# Patient Record
Sex: Female | Born: 1995 | Race: White | Hispanic: No | Marital: Married | Smoking: Never smoker
Health system: Southern US, Community
[De-identification: ages and names within clinical notes are randomized; demographics above are authoritative.]

## PROBLEM LIST (undated history)

## (undated) DIAGNOSIS — G9050 Complex regional pain syndrome I, unspecified: Secondary | ICD-10-CM

## (undated) DIAGNOSIS — G90A Postural orthostatic tachycardia syndrome (POTS): Secondary | ICD-10-CM

## (undated) DIAGNOSIS — J45909 Unspecified asthma, uncomplicated: Secondary | ICD-10-CM

## (undated) HISTORY — DX: Postural orthostatic tachycardia syndrome (POTS): G90.A

## (undated) HISTORY — PX: INTERCOSTAL NERVE BLOCK: SHX5021

---
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
## 2017-04-02 ENCOUNTER — Ambulatory Visit (INDEPENDENT_AMBULATORY_CARE_PROVIDER_SITE_OTHER): Payer: BLUE CROSS/BLUE SHIELD

## 2017-04-02 ENCOUNTER — Other Ambulatory Visit: Payer: Self-pay

## 2017-04-02 ENCOUNTER — Ambulatory Visit
Admission: EM | Admit: 2017-04-02 | Discharge: 2017-04-02 | Disposition: A | Discharge status: Home / Self Care | Payer: BLUE CROSS/BLUE SHIELD | Attending: Family Medicine | Admitting: Family Medicine

## 2017-04-02 DIAGNOSIS — R079 Chest pain, unspecified: Secondary | ICD-10-CM | POA: Diagnosis not present

## 2017-04-02 DIAGNOSIS — R0789 Other chest pain: Secondary | ICD-10-CM

## 2017-04-02 DIAGNOSIS — R0602 Shortness of breath: Secondary | ICD-10-CM

## 2017-04-02 DIAGNOSIS — G9050 Complex regional pain syndrome I, unspecified: Secondary | ICD-10-CM | POA: Insufficient documentation

## 2017-04-02 DIAGNOSIS — J069 Acute upper respiratory infection, unspecified: Secondary | ICD-10-CM

## 2017-04-02 DIAGNOSIS — M940 Chondrocostal junction syndrome [Tietze]: Secondary | ICD-10-CM | POA: Diagnosis not present

## 2017-04-02 HISTORY — DX: Complex regional pain syndrome I, unspecified: G90.50

## 2017-04-02 LAB — BASIC METABOLIC PANEL
ANION GAP: 9 (ref 5–15)
BUN: 23 mg/dL — ABNORMAL HIGH (ref 6–20)
CALCIUM: 9.3 mg/dL (ref 8.9–10.3)
CO2: 26 mmol/L (ref 22–32)
Chloride: 103 mmol/L (ref 101–111)
Creatinine, Ser: 0.72 mg/dL (ref 0.44–1.00)
Glucose, Bld: 114 mg/dL — ABNORMAL HIGH (ref 65–99)
Potassium: 3.6 mmol/L (ref 3.5–5.1)
Sodium: 138 mmol/L (ref 135–145)

## 2017-04-02 LAB — CBC WITH DIFFERENTIAL/PLATELET
BASOS ABS: 0 10*3/uL (ref 0–0.1)
BASOS PCT: 0 %
Eosinophils Absolute: 0.1 10*3/uL (ref 0–0.7)
Eosinophils Relative: 1 %
HCT: 39 % (ref 35.0–47.0)
Hemoglobin: 13.2 g/dL (ref 12.0–16.0)
Lymphocytes Relative: 37 %
Lymphs Abs: 2.7 10*3/uL (ref 1.0–3.6)
MCH: 29.7 pg (ref 26.0–34.0)
MCHC: 33.9 g/dL (ref 32.0–36.0)
MCV: 87.6 fL (ref 80.0–100.0)
MONO ABS: 0.4 10*3/uL (ref 0.2–0.9)
Monocytes Relative: 6 %
Neutro Abs: 4.1 10*3/uL (ref 1.4–6.5)
Neutrophils Relative %: 56 %
PLATELETS: 240 10*3/uL (ref 150–440)
RBC: 4.46 MIL/uL (ref 3.80–5.20)
RDW: 12.7 % (ref 11.5–14.5)
WBC: 7.3 10*3/uL (ref 3.6–11.0)

## 2017-04-02 LAB — TROPONIN I

## 2017-04-02 LAB — FIBRIN DERIVATIVES D-DIMER (ARMC ONLY): Fibrin derivatives D-dimer (ARMC): 94.03 ng/mL (FEU) (ref 0.00–499.00)

## 2017-04-02 MED ORDER — MELOXICAM 7.5 MG PO TABS: 7.5000 mg | ORAL_TABLET | Freq: Every day | ORAL | 0 refills | Status: DC

## 2017-04-02 NOTE — ED Triage Notes (Signed)
Patient complains of center chest pain that started 1 week ago but intensified yesterday. Patient states that the pain is sharp and takes her breath away. Patient reports that the pain has been constant. States that she has shortness of breath.

## 2017-04-02 NOTE — Discharge Instructions (Signed)
Take medication as prescribed. Rest. Drink plenty of fluids.  ° °Follow up with your primary care physician this week as needed. Return to Urgent care for new or worsening concerns.  ° °

## 2017-04-02 NOTE — ED Provider Notes (Signed)
[facility] ____________________________________________  Time seen: Approximately 8:49 PM  I have reviewed the triage vital signs and the nursing notes.   HISTORY  Chief Complaint Chest Pain   HPI Sherry Lutz is a 22 y.o. female presenting for evaluation of midsternal chest pain and intermittent shortness of breath that is been present for 1 week and felt like it worsened yesterday.  States pain is always constant but intermittent has episodes that pain is worse and denies aggravating or alleviating factors.  Denies fall, injury or trauma.  Reports has had some recent nasal congestion and bilateral ear congestion for the same time..  No coughing.  Denies cough, sore throat, fevers, hemoptysis, sinus pressure, thick drainage, rash,  extremity swelling, extremity pain, recent trips, recent immobilization, history of cancer.  Denies personal cardiac history or blood clot history and also denies family cardiac history or blood clot history, except for reporting her sister had congenital heart defects at birth and has had multiple heart surgeries.  Patient states that pain is in the middle of her chest and also throughout the whole front of her chest and does hurt some worse with palpation.  Denies pain with movement.  States shortness of breath is intermittent.  States that she does feel some anxiety about the chest pain but does not feel like the chest pain is caused by anxiety itself.  Reports otherwise feels well.  Denies other recent sickness or recent changes.  Not a smoker.  Denies drugs or alcohol.  Does take oral contraceptives.  Denies chance of pregnancy.  Denies abdominal pain, dysuria, extremity pain, extremity swelling or rash. Denies recent sickness. Denies recent antibiotic use.   [place]: PCP No LMP recorded. Patient is not currently having periods (Reason: Oral contraceptives).   Past Medical History:  Diagnosis Date  . RSD (reflex  sympathetic dystrophy)    foot    There are no active problems to display for this patient.   History reviewed. No pertinent surgical history.   No current facility-administered medications for this encounter.   Current Outpatient Medications:  .  norethindrone-ethinyl estradiol-iron (ESTROSTEP FE,TILIA FE,TRI-LEGEST FE) 1-20/1-30/1-35 MG-MCG tablet, Take 1 tablet by mouth daily., Disp: , Rfl:  .  meloxicam (MOBIC) 7.5 MG tablet, Take 1 tablet (7.5 mg total) by mouth daily., Disp: 10 tablet, Rfl: 0  Allergies Patient has no known allergies.  family history NO PE/DVT Sister: congenital heart defect  Social History Social History   Tobacco Use  . Smoking status: Never Smoker  . Smokeless tobacco: Never Used  Substance Use Topics  . Alcohol use: No    Frequency: Never  . Drug use: No    Review of Systems Constitutional: No fever/chills Eyes: No visual changes. ENT: No sore throat. Cardiovascular: As above.  Respiratory: As above.  Denies current shortness of breath. Gastrointestinal: No abdominal pain.  No nausea, no vomiting.  No diarrhea.  No constipation. Genitourinary: Negative for dysuria. Musculoskeletal: Negative for back pain. Skin: Negative for rash. Neurological: Negative for headaches, focal weakness or numbness.  ____________________________________________   PHYSICAL EXAM:  VITAL SIGNS: ED Triage Vitals  Enc Vitals Group     BP 04/02/17 1900 139/79     Pulse Rate 04/02/17 1900 79     Resp 04/02/17 1900 18     Temp 04/02/17 1900 98.7 F (37.1 C)     Temp Source 04/02/17 1900 Oral     SpO2 04/02/17 1900 100 %     Weight 04/02/17  1858 128 lb (58.1 kg)     Height 04/02/17 1858 5\' 2"  (1.575 m)     Head Circumference --      Peak Flow --      Pain Score 04/02/17 1858 9     Pain Loc --      Pain Edu? --      Excl. in GC? --    Constitutional: Alert and oriented. Well appearing and in no acute distress. Eyes: Conjunctivae are normal.  Head:  Atraumatic. No sinus tenderness to palpation. No swelling. No erythema.  Ears: no erythema, mild bilateral effusion, normal TMs bilaterally.   Nose:No nasal congestion   Mouth/Throat: Mucous membranes are moist. No pharyngeal erythema. No tonsillar swelling or exudate.  Neck: No stridor.  No cervical spine tenderness to palpation. Hematological/Lymphatic/Immunilogical: No cervical lymphadenopathy. Cardiovascular: Normal rate, regular rhythm. Grossly normal heart sounds.  Good peripheral circulation. Respiratory: Normal respiratory effort.  No retractions. No wheezes, rales or rhonchi. Good air movement.  Gastrointestinal: Mild epigastric tenderness.  Abdomen otherwise soft and nontender. No CVA tenderness. Musculoskeletal: Ambulatory with steady gait. No cervical, thoracic or lumbar tenderness to palpation.  Bilateral lower extremities nontender and no edema.  Bilateral distal pedal pulses equal and easily palpated.  Anterior chest bilateral as well as midsternal diffuse tenderness to palpation and per patient consistent with current chest pain description. Neurologic:  Normal speech and language. No gait instability.  No focal neurological deficits. Skin:  Skin appears warm, dry and intact. No rash noted. Psychiatric: Mood and affect are normal. Speech and behavior are normal.  Well's criteria for PE=0. Low risk group. ___________________________________________   LABS (all labs ordered are listed, but only abnormal results are displayed)  Labs Reviewed  BASIC METABOLIC PANEL - Abnormal; Notable for the following components:      Result Value   Glucose, Bld 114 (*)    BUN 23 (*)    All other components within normal limits  CBC WITH DIFFERENTIAL/PLATELET  FIBRIN DERIVATIVES D-DIMER ([facility] ONLY)  TROPONIN I   ____________________________________________  EKG  ED ECG REPORT I, Renford Dills, the attending provider, personally viewed and interpreted this ECG.   Date: 04/02/2017   EKG Time: 1907  Rate: 82  Rhythm: normal sinus rhythm with sinus arrhythmia   Axis: normal  Intervals:none  ST&T Change: no ST or T wave elevation or depression noted.    No previous EKG available for comparison.    ____________________________________________  RADIOLOGY  Dg Chest 2 View  Result Date: 04/02/2017 CLINICAL DATA:  Chest pain and shortness of breath. EXAM: CHEST  2 VIEW COMPARISON:  None. FINDINGS: The heart size and mediastinal contours are within normal limits. There is no evidence of pulmonary edema, consolidation, pneumothorax, nodule or pleural fluid. The visualized skeletal structures are unremarkable. IMPRESSION: No active cardiopulmonary disease. Electronically Signed   By: Irish Lack M.D.   On: 04/02/2017 19:59   ____________________________________________   PROCEDURES Procedures     INITIAL IMPRESSION / ASSESSMENT AND PLAN / ED COURSE  Pertinent labs & imaging results that were available during my care of the patient were reviewed by me and considered in my medical decision making (see chart for details).  Very well-appearing patient.  No acute distress.  Mother and sister at bedside.  Chest pain for 1 week worsened since yesterday with some intermittent shortness of breath, also suspect recent upper respiratory infection.  Suspect most likely costochondritis, felt ess likely ACS, pneumothorax or pulmonary embolism.  Patient only risk factor  for PE oral contraceptives.  Discussed this in very detail with patient and family who does request to have d-dimer and troponin evaluated.  Labs reviewed.  Troponin and d-dimer negative.  Chest x-ray as above negative per radiologist.  Discussed labs in detail including slightly elevated BUN.  Suspect likely culprit is costochondritis.  Will treat with daily Mobic as needed.  Encourage rest, fluids, supportive care and discussed strict follow-up and return parameters with primary as well as up to need an proceeding to  ER.Discussed indication, risks and benefits of medications with patient.  Discussed follow up with Primary care physician this week. Discussed follow up and return parameters including no resolution or any worsening concerns. Patient verbalized understanding and agreed to plan.   ____________________________________________   FINAL CLINICAL IMPRESSION(S) / ED DIAGNOSES  Final diagnoses:  Atypical chest pain  Costochondritis  Acute upper respiratory infection     ED Discharge Orders        Ordered    meloxicam (MOBIC) 7.5 MG tablet  Daily,   Status:  Discontinued     04/02/17 2032    meloxicam (MOBIC) 7.5 MG tablet  Daily     04/02/17 2039       Note: This dictation was prepared with Dragon dictation along with smaller phrase technology. Any transcriptional errors that result from this process are unintentional.         Renford DillsMiller, Annabeth Tortora, NP 04/02/17 [phone]

## 2017-08-14 ENCOUNTER — Other Ambulatory Visit
Admission: RE | Admit: 2017-08-14 | Discharge: 2017-08-14 | Disposition: A | Discharge status: Home / Self Care | Payer: BLUE CROSS/BLUE SHIELD | Source: Ambulatory Visit | Attending: Specialist | Admitting: Specialist

## 2018-05-25 ENCOUNTER — Ambulatory Visit
Admission: EM | Admit: 2018-05-25 | Discharge: 2018-05-25 | Disposition: A | Discharge status: Other Acute Inpatient Hospital | Payer: BLUE CROSS/BLUE SHIELD

## 2018-05-25 ENCOUNTER — Encounter: Payer: Self-pay | Admitting: Emergency Medicine

## 2018-05-25 ENCOUNTER — Other Ambulatory Visit: Payer: Self-pay

## 2018-05-25 DIAGNOSIS — R1031 Right lower quadrant pain: Secondary | ICD-10-CM | POA: Diagnosis not present

## 2018-05-25 NOTE — Discharge Instructions (Addendum)
Go straight to ER, do not eat or drink anything until seen by Er provider and cleared.

## 2018-05-25 NOTE — ED Provider Notes (Signed)
[facility]    CSN: [id number] Arrival date & time: 05/25/18  1754     History   Chief Complaint Chief Complaint  Patient presents with  . Abdominal Pain    RLQ     HPI Sherry Lutz is a 23 y.o. female.   The history is provided by the patient. No language interpreter was used.  Abdominal Pain  Pain location:  RLQ Pain quality: sharp and stabbing   Pain radiates to:  Does not radiate Pain severity:  Severe Onset quality:  Sudden Duration:  1 day Timing:  Constant Progression:  Worsening Chronicity:  New Context: not suspicious food intake and not trauma   Relieved by:  Nothing Ineffective treatments: walking,movement, palpation. Associated symptoms: diarrhea and nausea   Associated symptoms: no vomiting   Risk factors comment:  Pt uses OBCP's no periods   Past Medical History:  Diagnosis Date  . RSD (reflex sympathetic dystrophy)    foot    Patient Active Problem List   Diagnosis Date Noted  . Abdominal pain, RLQ (right lower quadrant) 05/25/2018    Past Surgical History:  Procedure Laterality Date  . INTERCOSTAL NERVE BLOCK      OB History   No obstetric history on file.      Home Medications    Prior to Admission medications   Medication Sig Start Date End Date Taking? Authorizing Provider  meloxicam (MOBIC) 7.5 MG tablet Take 1 tablet (7.5 mg total) by mouth daily. 04/02/17  Yes Renford Dills, NP  norethindrone-ethinyl estradiol-iron (ESTROSTEP FE,TILIA FE,TRI-LEGEST FE) 1-20/1-30/1-35 MG-MCG tablet Take 1 tablet by mouth daily.   Yes [provider]    Family History Family History  Problem Relation Age of Onset  . Healthy Mother   . Healthy Father     Social History Social History   Tobacco Use  . Smoking status: Never Smoker  . Smokeless tobacco: Never Used  Substance Use Topics  . Alcohol use: No    Frequency: Never  . Drug use: No     Allergies   Pregabalin and Red dye   Review of  Systems Review of Systems  Gastrointestinal: Positive for abdominal pain, diarrhea and nausea. Negative for vomiting.  All other systems reviewed and are negative.    Physical Exam Triage Vital Signs ED Triage Vitals  Enc Vitals Group     BP 05/25/18 1808 135/89     Pulse Rate 05/25/18 1808 89     Resp --      Temp 05/25/18 1808 98 F (36.7 C)     Temp Source 05/25/18 1808 Oral     SpO2 05/25/18 1808 100 %     Weight 05/25/18 1809 125 lb (56.7 kg)     Height 05/25/18 1809 5\' 3"  (1.6 m)     Head Circumference --      Peak Flow --      Pain Score 05/25/18 1808 7     Pain Loc --      Pain Edu? --      Excl. in GC? --    No data found.  Updated Vital Signs BP 135/89 (BP Location: Left Arm)   Pulse 89   Temp 98 F (36.7 C) (Oral)   Ht 5\' 3"  (1.6 m)   Wt 125 lb (56.7 kg)   SpO2 100%   BMI 22.14 kg/m      Physical Exam Vitals signs and nursing note reviewed.  Constitutional:      Appearance:  She is well-developed.  Abdominal:     General: Bowel sounds are increased.     Tenderness: There is abdominal tenderness in the right lower quadrant. There is guarding and rebound. Positive signs include psoas sign.  Neurological:     General: No focal deficit present.     Mental Status: She is alert and oriented to person, place, and time.     GCS: GCS eye subscore is 4. GCS verbal subscore is 5. GCS motor subscore is 6.  Psychiatric:        Attention and Perception: Attention normal.        Thought Content: Thought content normal.      UC Treatments / Results  Labs (all labs ordered are listed, but only abnormal results are displayed) Labs Reviewed - No data to display  EKG None  Radiology No results found.  Procedures Procedures (including critical care time)  Medications Ordered in UC Medications - No data to display  Initial Impression / Assessment and Plan / UC Course  I have reviewed the triage vital signs and the nursing notes.  Pertinent labs &  imaging results that were available during my care of the patient were reviewed by me and considered in my medical decision making (see chart for details).     Pt sent to Er for further evaluation and testing to r/o ovarian cyst, ovarian torsion or appendicitis. Mom w patient, pt verbalized understanding to this provider.  Final Clinical Impressions(s) / UC Diagnoses   Final diagnoses:  Abdominal pain, RLQ (right lower quadrant)     Discharge Instructions     Go straight to ER, do not eat or drink anything until seen by Er provider and cleared.     ED Prescriptions    None     Controlled Substance Prescriptions    Clancy Gourd, NP 05/25/18 2045

## 2018-05-25 NOTE — ED Triage Notes (Signed)
Patient in today c/o RLQ abdominal pain that started last night after eating. Patient states the pain is getting worse. Hurts to the touch and is nauseous. Patient has felt clammy, but hasn't taken her temperature.

## 2018-12-20 ENCOUNTER — Other Ambulatory Visit: Payer: Self-pay

## 2018-12-20 ENCOUNTER — Ambulatory Visit
Admission: EM | Admit: 2018-12-20 | Discharge: 2018-12-20 | Disposition: A | Discharge status: Home / Self Care | Payer: BLUE CROSS/BLUE SHIELD | Attending: Internal Medicine | Admitting: Internal Medicine

## 2018-12-20 DIAGNOSIS — B373 Candidiasis of vulva and vagina: Secondary | ICD-10-CM | POA: Diagnosis not present

## 2018-12-20 DIAGNOSIS — Z3202 Encounter for pregnancy test, result negative: Secondary | ICD-10-CM | POA: Diagnosis not present

## 2018-12-20 DIAGNOSIS — N760 Acute vaginitis: Secondary | ICD-10-CM | POA: Diagnosis not present

## 2018-12-20 DIAGNOSIS — B9689 Other specified bacterial agents as the cause of diseases classified elsewhere: Secondary | ICD-10-CM

## 2018-12-20 DIAGNOSIS — N3000 Acute cystitis without hematuria: Secondary | ICD-10-CM | POA: Diagnosis not present

## 2018-12-20 DIAGNOSIS — B3731 Acute candidiasis of vulva and vagina: Secondary | ICD-10-CM

## 2018-12-20 HISTORY — DX: Unspecified asthma, uncomplicated: J45.909

## 2018-12-20 LAB — URINALYSIS, COMPLETE (UACMP) WITH MICROSCOPIC
Bilirubin Urine: NEGATIVE
Glucose, UA: NEGATIVE mg/dL
Ketones, ur: NEGATIVE mg/dL
Nitrite: NEGATIVE
Protein, ur: NEGATIVE mg/dL
Specific Gravity, Urine: >1.03 — ABNORMAL HIGH (ref 1.005–1.030)
pH: 6 (ref 5.0–8.0)

## 2018-12-20 LAB — WET PREP, GENITAL
Sperm: NONE SEEN
Trich, Wet Prep: NONE SEEN

## 2018-12-20 LAB — PREGNANCY, URINE: Preg Test, Ur: NEGATIVE

## 2018-12-20 MED ORDER — FLUCONAZOLE 150 MG PO TABS: ORAL_TABLET | ORAL | 0 refills | Status: DC

## 2018-12-20 MED ORDER — PHENAZOPYRIDINE HCL 200 MG PO TABS: 200.0000 mg | ORAL_TABLET | Freq: Three times a day (TID) | ORAL | 0 refills | Status: DC

## 2018-12-20 MED ORDER — CEPHALEXIN 500 MG PO CAPS: 500.0000 mg | ORAL_CAPSULE | Freq: Two times a day (BID) | ORAL | 0 refills | Status: DC

## 2018-12-20 MED ORDER — METRONIDAZOLE 500 MG PO TABS: 500.0000 mg | ORAL_TABLET | Freq: Two times a day (BID) | ORAL | 0 refills | Status: DC

## 2018-12-20 NOTE — ED Provider Notes (Addendum)
[facility]    CSN: [id number] Arrival date & time: 12/20/18  1742      History   Chief Complaint Chief Complaint  Patient presents with  . Urinary Frequency    HPI Sherry Lutz is a 23 y.o. female.   HPI  23 year old female presents with urinary frequency and dysuria described as burning and malodorous smelling urine that started yesterday.  She states that it feels similar to previous UTIs.  Has lower abdominal discomfort as well.  Denies any back pain.  She has had no fever chills nausea or vomiting.  Is sexually active.  Has noticed a white discharge which is unusual for her.  Has not noticed any external irritation.  Has recently gotten married; she has no concerns for STDs.  Afebrile at 98.2 pulse rate 82 blood pressure 150/83 O2 sats on room air 100%.         Past Medical History:  Diagnosis Date  . Asthma   . RSD (reflex sympathetic dystrophy)    foot    Patient Active Problem List   Diagnosis Date Noted  . Abdominal pain, RLQ (right lower quadrant) 05/25/2018    Past Surgical History:  Procedure Laterality Date  . INTERCOSTAL NERVE BLOCK      OB History   No obstetric history on file.      Home Medications    Prior to Admission medications   Medication Sig Start Date End Date Taking? Authorizing Provider  norethindrone-ethinyl estradiol-iron (ESTROSTEP FE,TILIA FE,TRI-LEGEST FE) 1-20/1-30/1-35 MG-MCG tablet Take 1 tablet by mouth daily.   Yes [provider]  cephALEXin (KEFLEX) 500 MG capsule Take 1 capsule (500 mg total) by mouth 2 (two) times daily. 12/20/18   Lorin Picket, PA-C  fluconazole (DIFLUCAN) 150 MG tablet Take one tab for symptoms of yeast infection. Repeat x 1 in 72 hours. 12/20/18   Lorin Picket, PA-C  metroNIDAZOLE (FLAGYL) 500 MG tablet Take 1 tablet (500 mg total) by mouth 2 (two) times daily. 12/20/18   Lorin Picket, PA-C  phenazopyridine (PYRIDIUM) 200 MG tablet Take 1 tablet (200 mg total)  by mouth 3 (three) times daily. 12/20/18   Lorin Picket, PA-C    Family History Family History  Problem Relation Age of Onset  . Healthy Mother   . Healthy Father     Social History Social History   Tobacco Use  . Smoking status: Never Smoker  . Smokeless tobacco: Never Used  Substance Use Topics  . Alcohol use: No    Frequency: Never  . Drug use: No     Allergies   Pregabalin and Red dye   Review of Systems Review of Systems  Constitutional: Positive for activity change. Negative for appetite change, chills, fatigue and fever.  Genitourinary: Positive for dysuria, frequency, urgency and vaginal discharge. Negative for hematuria.  All other systems reviewed and are negative.    Physical Exam Triage Vital Signs ED Triage Vitals  Enc Vitals Group     BP 12/20/18 1751 (!) 150/83     Pulse Rate 12/20/18 1751 82     Resp 12/20/18 1751 18     Temp 12/20/18 1751 98.2 F (36.8 C)     Temp Source 12/20/18 1751 Oral     SpO2 12/20/18 1751 100 %     Weight 12/20/18 1752 130 lb (59 kg)     Height 12/20/18 1752 5\' 2"  (1.575 m)     Head Circumference --  Peak Flow --      Pain Score 12/20/18 1751 4     Pain Loc --      Pain Edu? --      Excl. in GC? --    No data found.  Updated Vital Signs BP (!) 150/83 (BP Location: Right Arm)   Pulse 82   Temp 98.2 F (36.8 C) (Oral)   Resp 18   Ht 5\' 2"  (1.575 m)   Wt 130 lb (59 kg)   LMP 12/06/2018 (Approximate)   SpO2 100%   BMI 23.78 kg/m   Visual Acuity Right Eye Distance:   Left Eye Distance:   Bilateral Distance:    Right Eye Near:   Left Eye Near:    Bilateral Near:     Physical Exam Vitals signs and nursing note reviewed.  Constitutional:      General: She is not in acute distress.    Appearance: Normal appearance. She is normal weight. She is not ill-appearing, toxic-appearing or diaphoretic.  HENT:     Head: Normocephalic and atraumatic.     Mouth/Throat:     Mouth: Mucous membranes are  moist.  Eyes:     Conjunctiva/sclera: Conjunctivae normal.  Neck:     Musculoskeletal: Normal range of motion and neck supple.  Cardiovascular:     Rate and Rhythm: Normal rate and regular rhythm.     Pulses: Normal pulses.     Heart sounds: Normal heart sounds.  Pulmonary:     Effort: Pulmonary effort is normal. No respiratory distress.     Breath sounds: Normal breath sounds. No stridor. No wheezing, rhonchi or rales.  Abdominal:     General: Abdomen is flat. There is no distension.     Palpations: Abdomen is soft.     Tenderness: There is abdominal tenderness. There is no right CVA tenderness, left CVA tenderness, guarding or rebound.     Comments: Patient has normal bowel sounds present.  She has mild tenderness in the suprapubic region.  No rebound and no guarding present.  Genitourinary:    Comments: Patient performed a self swab for a wet prep Musculoskeletal: Normal range of motion.  Skin:    General: Skin is warm and dry.  Neurological:     General: No focal deficit present.     Mental Status: She is alert and oriented to person, place, and time.  Psychiatric:        Mood and Affect: Mood normal.        Behavior: Behavior normal.        Thought Content: Thought content normal.        Judgment: Judgment normal.      UC Treatments / Results  Labs (all labs ordered are listed, but only abnormal results are displayed) Labs Reviewed  WET PREP, GENITAL - Abnormal; Notable for the following components:      Result Value   Yeast Wet Prep HPF POC PRESENT (*)    Clue Cells Wet Prep HPF POC PRESENT (*)    WBC, Wet Prep HPF POC MANY (*)    All other components within normal limits  URINALYSIS, COMPLETE (UACMP) WITH MICROSCOPIC - Abnormal; Notable for the following components:   APPearance HAZY (*)    Specific Gravity, Urine >1.030 (*)    Hgb urine dipstick TRACE (*)    Leukocytes,Ua SMALL (*)    Bacteria, UA FEW (*)    All other components within normal limits  URINE  CULTURE  PREGNANCY, URINE  EKG   Radiology No results found.  Procedures Procedures (including critical care time)  Medications Ordered in UC Medications - No data to display  Initial Impression / Assessment and Plan / UC Course  I have reviewed the triage vital signs and the nursing notes.  Pertinent labs & imaging results that were available during my care of the patient were reviewed by me and considered in my medical decision making (see chart for details).    23 year old female presents with urinary frequency and dysuria described as burning along with malodorous urine.  She also reported a unusual vaginal discharge.  Recently married and had just completed her honeymoon.  She had no concerns for STDs.  Urinalysis was not an adequate clean-catch did reveal 6-10 epithelial cells and 6-10 RBCs and 11-20 white blood cells along with a few bacteria.  Patient did perform a self wet prep swab which was positive for yeast and clue cells.  I started her on Keflex twice daily for 5 days Pyridium because of the urinary burning which she states was rather severe as well as Diflucan and Flagyl.  Given precautions of not drinking alcohol while on the Flagyl and to really abstain from sexual intercourse during that time.  She has an allergy to red dye which she states causes a rash mostly on her hands and face.  States that she wants to take the medications despite the warnings regarding red dye allergy.  She may take a Claritin or Zyrtec or Allegra for prophylactically which may help with the rash.  I will also obtain a urine culture.  If she is not improving in 2 to 4 days she should follow-up with her primary care physician or return to our clinic.   Final Clinical Impressions(s) / UC Diagnoses   Final diagnoses:  Acute cystitis without hematuria  BV (bacterial vaginosis)  Yeast vaginitis     Discharge Instructions     Drink plenty of water.  Do not drink alcohol while taking Flagyl.   If you are not improving recommend following up with your primary care physician    ED Prescriptions    Medication Sig Dispense Auth. Provider   cephALEXin (KEFLEX) 500 MG capsule Take 1 capsule (500 mg total) by mouth 2 (two) times daily. 10 capsule Ovid Curd P, PA-C   phenazopyridine (PYRIDIUM) 200 MG tablet Take 1 tablet (200 mg total) by mouth 3 (three) times daily. 6 tablet Ovid Curd P, PA-C   metroNIDAZOLE (FLAGYL) 500 MG tablet Take 1 tablet (500 mg total) by mouth 2 (two) times daily. 14 tablet Ovid Curd P, PA-C   fluconazole (DIFLUCAN) 150 MG tablet Take one tab for symptoms of yeast infection. Repeat x 1 in 72 hours. 2 tablet Lutricia Feil, PA-C     PDMP not reviewed this encounter.       Lutricia Feil, PA-C 12/20/18 1928

## 2018-12-20 NOTE — ED Triage Notes (Signed)
Reports urinary frequency and burning that started yesterday. Pt alert and oriented X4, cooperative, RR even and unlabored, color WNL. Pt in NAD.

## 2018-12-20 NOTE — Discharge Instructions (Signed)
Drink plenty of water.  Do not drink alcohol while taking Flagyl.  If you are not improving recommend following up with your primary care physician

## 2018-12-22 LAB — URINE CULTURE

## 2019-01-28 DIAGNOSIS — J45909 Unspecified asthma, uncomplicated: Secondary | ICD-10-CM | POA: Insufficient documentation

## 2019-03-29 ENCOUNTER — Encounter: Payer: Self-pay | Admitting: Emergency Medicine

## 2019-03-29 ENCOUNTER — Other Ambulatory Visit: Payer: Self-pay

## 2019-03-29 ENCOUNTER — Ambulatory Visit
Admission: EM | Admit: 2019-03-29 | Discharge: 2019-03-29 | Disposition: A | Discharge status: Home / Self Care | Payer: BLUE CROSS/BLUE SHIELD | Attending: Family Medicine | Admitting: Family Medicine

## 2019-03-29 DIAGNOSIS — R110 Nausea: Secondary | ICD-10-CM | POA: Insufficient documentation

## 2019-03-29 DIAGNOSIS — Z8709 Personal history of other diseases of the respiratory system: Secondary | ICD-10-CM

## 2019-03-29 DIAGNOSIS — Z20822 Contact with and (suspected) exposure to covid-19: Secondary | ICD-10-CM | POA: Insufficient documentation

## 2019-03-29 DIAGNOSIS — Z79899 Other long term (current) drug therapy: Secondary | ICD-10-CM | POA: Diagnosis not present

## 2019-03-29 DIAGNOSIS — R05 Cough: Secondary | ICD-10-CM | POA: Diagnosis not present

## 2019-03-29 DIAGNOSIS — Z7952 Long term (current) use of systemic steroids: Secondary | ICD-10-CM | POA: Diagnosis not present

## 2019-03-29 DIAGNOSIS — R0602 Shortness of breath: Secondary | ICD-10-CM | POA: Insufficient documentation

## 2019-03-29 DIAGNOSIS — J45909 Unspecified asthma, uncomplicated: Secondary | ICD-10-CM | POA: Diagnosis not present

## 2019-03-29 MED ORDER — PROAIR HFA 108 (90 BASE) MCG/ACT IN AERS: INHALATION_SPRAY | RESPIRATORY_TRACT | 3 refills | Status: DC

## 2019-03-29 MED ORDER — PREDNISONE 50 MG PO TABS: ORAL_TABLET | ORAL | 0 refills | Status: DC

## 2019-03-29 NOTE — ED Triage Notes (Signed)
Patient states her brother tested positive for COVID on Saturday. She is c/o shortness of breath that started a few days ago and cough that started yesterday. She is also reporting nausea.

## 2019-03-29 NOTE — Discharge Instructions (Signed)
Medication as prescribed.  Take care  Dr. Seraj Dunnam  

## 2019-03-29 NOTE — ED Provider Notes (Signed)
[facility]    CSN: [id number] Arrival date & time: 03/29/19  1407  History   Chief Complaint Chief Complaint  Patient presents with  . Shortness of Breath  . Nausea   HPI  24 year old female with asthma presents with respiratory symptoms and concern for COVID-19.  Patient reports that she was recently exposed to her brother who has tested positive for COVID-19.  She reports shortness of breath, chest tightness, cough.  She is also had some abdominal pain and nausea.  She is concerned that she has COVID-19.  She desires testing today.  Rates her pain a 6/10 in severity.  Pain located in the center of the chest.  She is compliant with her home asthma medications.  No other associated symptoms.  No other complaints.  PMH, Surgical Hx, Family Hx, Social History reviewed and updated as below.  Past Medical History:  Diagnosis Date  . Asthma   . RSD (reflex sympathetic dystrophy)    foot   Patient Active Problem List   Diagnosis Date Noted  . Abdominal pain, RLQ (right lower quadrant) 05/25/2018   Past Surgical History:  Procedure Laterality Date  . INTERCOSTAL NERVE BLOCK     OB History   No obstetric history on file.    Home Medications    Prior to Admission medications   Medication Sig Start Date End Date Taking? Authorizing Provider  fluticasone (FLOVENT HFA) 110 MCG/ACT inhaler Inhale into the lungs. 02/25/19 02/25/20 Yes [provider]  montelukast (SINGULAIR) 10 MG tablet Take 10 mg by mouth daily. 11/28/18  Yes [provider]  norethindrone-ethinyl estradiol-iron (ESTROSTEP FE,TILIA FE,TRI-LEGEST FE) 1-20/1-30/1-35 MG-MCG tablet Take 1 tablet by mouth daily.   Yes [provider]  predniSONE (DELTASONE) 50 MG tablet 1 tablet daily x 5 days 03/29/19   Tommie Sams, DO  PROAIR HFA 108 [phone] Base) MCG/ACT inhaler INHALE 1 PUFF BY MOUTH EVERY 6 HOURS AS NEEDED FOR WHEEZING 03/29/19   Tommie Sams, DO    Family History Family History    Problem Relation Age of Onset  . Healthy Mother   . Healthy Father     Social History Social History   Tobacco Use  . Smoking status: Never Smoker  . Smokeless tobacco: Never Used  Substance Use Topics  . Alcohol use: No  . Drug use: No     Allergies   Pregabalin and Red dye   Review of Systems Review of Systems  Constitutional: Negative for fever.  Respiratory: Positive for cough, chest tightness and shortness of breath.   Gastrointestinal: Positive for abdominal pain and nausea.   Physical Exam Triage Vital Signs ED Triage Vitals  Enc Vitals Group     BP 03/29/19 1443 135/90     Pulse Rate 03/29/19 1443 76     Resp 03/29/19 1443 18     Temp 03/29/19 1443 98.4 F (36.9 C)     Temp Source 03/29/19 1443 Oral     SpO2 03/29/19 1443 100 %     Weight 03/29/19 1439 130 lb (59 kg)     Height 03/29/19 1439 5\' 2"  (1.575 m)     Head Circumference --      Peak Flow --      Pain Score 03/29/19 1439 6     Pain Loc --      Pain Edu? --      Excl. in GC? --    Updated Vital Signs BP 135/90 (BP Location: Right Arm)  Pulse 76   Temp 98.4 F (36.9 C) (Oral)   Resp 18   Ht 5\' 2"  (1.575 m)   Wt 59 kg   SpO2 100%   BMI 23.78 kg/m   Visual Acuity Right Eye Distance:   Left Eye Distance:   Bilateral Distance:    Right Eye Near:   Left Eye Near:    Bilateral Near:     Physical Exam Vitals and nursing note reviewed.  Constitutional:      General: She is not in acute distress.    Appearance: Normal appearance. She is not ill-appearing.  HENT:     Head: Normocephalic and atraumatic.  Eyes:     General:        Right eye: No discharge.        Left eye: No discharge.     Conjunctiva/sclera: Conjunctivae normal.  Cardiovascular:     Rate and Rhythm: Normal rate and regular rhythm.     Heart sounds: No murmur.  Pulmonary:     Effort: Pulmonary effort is normal.     Breath sounds: Normal breath sounds. No wheezing, rhonchi or rales.  Neurological:     Mental  Status: She is alert.  Psychiatric:        Mood and Affect: Mood normal.        Behavior: Behavior normal.    UC Treatments / Results  Labs (all labs ordered are listed, but only abnormal results are displayed) Labs Reviewed  NOVEL CORONAVIRUS, NAA (HOSP ORDER, SEND-OUT TO REF LAB; TAT 18-24 HRS)    EKG   Radiology No results found.  Procedures Procedures (including critical care time)  Medications Ordered in UC Medications - No data to display  Initial Impression / Assessment and Plan / UC Course  I have reviewed the triage vital signs and the nursing notes.  Pertinent labs & imaging results that were available during my care of the patient were reviewed by me and considered in my medical decision making (see chart for details).    24 year old female presents with suspected COVID-19.  Given concurrent asthma, placing on prednisone.  ProAir given.  Supportive care.  Final Clinical Impressions(s) / UC Diagnoses   Final diagnoses:  Suspected COVID-19 virus infection     Discharge Instructions     Medication as prescribed.  Take care  Dr. Lacinda Axon    ED Prescriptions    Medication Sig Dispense Auth. Provider   predniSONE (DELTASONE) 50 MG tablet 1 tablet daily x 5 days 5 tablet Aiyonna Lucado G, DO   PROAIR HFA 108 (90 Base) MCG/ACT inhaler INHALE 1 PUFF BY MOUTH EVERY 6 HOURS AS NEEDED FOR WHEEZING 18 g Coral Spikes, DO     PDMP not reviewed this encounter.   Coral Spikes, [place] 03/29/19 1821

## 2019-03-30 LAB — NOVEL CORONAVIRUS, NAA (HOSP ORDER, SEND-OUT TO REF LAB; TAT 18-24 HRS): SARS-CoV-2, NAA: NOT DETECTED

## 2019-03-31 ENCOUNTER — Ambulatory Visit: Payer: BLUE CROSS/BLUE SHIELD | Attending: Internal Medicine

## 2019-03-31 DIAGNOSIS — Z20822 Contact with and (suspected) exposure to covid-19: Secondary | ICD-10-CM

## 2019-04-02 LAB — NOVEL CORONAVIRUS, NAA: SARS-CoV-2, NAA: NOT DETECTED

## 2019-08-29 ENCOUNTER — Ambulatory Visit (INDEPENDENT_AMBULATORY_CARE_PROVIDER_SITE_OTHER): Payer: BLUE CROSS/BLUE SHIELD

## 2019-08-29 ENCOUNTER — Encounter: Payer: Self-pay | Admitting: Emergency Medicine

## 2019-08-29 ENCOUNTER — Ambulatory Visit
Admission: EM | Admit: 2019-08-29 | Discharge: 2019-08-29 | Disposition: A | Discharge status: Home / Self Care | Payer: BLUE CROSS/BLUE SHIELD | Attending: Family Medicine | Admitting: Family Medicine

## 2019-08-29 ENCOUNTER — Other Ambulatory Visit: Payer: Self-pay

## 2019-08-29 DIAGNOSIS — K5900 Constipation, unspecified: Secondary | ICD-10-CM | POA: Insufficient documentation

## 2019-08-29 DIAGNOSIS — R109 Unspecified abdominal pain: Secondary | ICD-10-CM | POA: Diagnosis present

## 2019-08-29 LAB — CBC WITH DIFFERENTIAL/PLATELET
Abs Immature Granulocytes: 0.02 10*3/uL (ref 0.00–0.07)
Basophils Absolute: 0 10*3/uL (ref 0.0–0.1)
Basophils Relative: 0 %
Eosinophils Absolute: 0.1 10*3/uL (ref 0.0–0.5)
Eosinophils Relative: 1 %
HCT: 37.4 % (ref 36.0–46.0)
Hemoglobin: 12.3 g/dL (ref 12.0–15.0)
Immature Granulocytes: 0 %
Lymphocytes Relative: 33 %
Lymphs Abs: 2 10*3/uL (ref 0.7–4.0)
MCH: 29.1 pg (ref 26.0–34.0)
MCHC: 32.9 g/dL (ref 30.0–36.0)
MCV: 88.6 fL (ref 80.0–100.0)
Monocytes Absolute: 0.3 10*3/uL (ref 0.1–1.0)
Monocytes Relative: 5 %
Neutro Abs: 3.7 10*3/uL (ref 1.7–7.7)
Neutrophils Relative %: 61 %
Platelets: 271 10*3/uL (ref 150–400)
RBC: 4.22 MIL/uL (ref 3.87–5.11)
RDW: 12 % (ref 11.5–15.5)
WBC: 6.2 10*3/uL (ref 4.0–10.5)
nRBC: 0 % (ref 0.0–0.2)

## 2019-08-29 LAB — URINALYSIS, COMPLETE (UACMP) WITH MICROSCOPIC
Bilirubin Urine: NEGATIVE
Glucose, UA: NEGATIVE mg/dL
Hgb urine dipstick: NEGATIVE
Ketones, ur: NEGATIVE mg/dL
Leukocytes,Ua: NEGATIVE
Nitrite: NEGATIVE
Protein, ur: NEGATIVE mg/dL
RBC / HPF: NONE SEEN RBC/hpf (ref 0–5)
Specific Gravity, Urine: >1.03 — ABNORMAL HIGH (ref 1.005–1.030)
pH: 6.5 (ref 5.0–8.0)

## 2019-08-29 LAB — HCG, QUANTITATIVE, PREGNANCY: hCG, Beta Chain, Quant, S: <1 m[IU]/mL (ref <5)

## 2019-08-29 LAB — PREGNANCY, URINE: Preg Test, Ur: NEGATIVE

## 2019-08-29 NOTE — ED Triage Notes (Addendum)
Pt c/o lower abdominal cramping. Started about 2 weeks ago. She states she has also had some nausea. She has taken 2 home pregnancy test and both has been negative. She states she take her oral contraceptives straight through and does not have a period. She has been on this birth control for years. BM have been normal. She later states she has had some indigestion intermittently.

## 2019-08-29 NOTE — ED Provider Notes (Signed)
[facility]    CSN: [id number] Arrival date & time: 08/29/19  1500      History   Chief Complaint Chief Complaint  Patient presents with  . Abdominal Cramping    HPI Sherry Lutz is a 24 y.o. female. who presents with abdominal pain and gets worse after eating and or when she gets hungry. But today has been present all day. Pain is located on umbilical area and suprapubic region. The pain does not feel like perior cramps or when she had constipation issues last year and her CT showed inflammation. Has had increased vaginal discharge but denies concern of STD. She takes her birth control continuously.  Denies UTI symptoms, but has been voiding more often, and denies urgency. Denies vaginal bleeding. Today has been worse pain than the past 2 weeks.  Has tried Tyleno and Naproxen and has not helped her pain. Has has noticed increased heart burn in the past few days and has taken prilosec which has helped. Denies hx of ovarian cysts.  She has had increased appetite craving milk shakes    Past Medical History:  Diagnosis Date  . Asthma   . RSD (reflex sympathetic dystrophy)    foot    Patient Active Problem List   Diagnosis Date Noted  . Abdominal pain, RLQ (right lower quadrant) 05/25/2018    Past Surgical History:  Procedure Laterality Date  . INTERCOSTAL NERVE BLOCK      OB History   No obstetric history on file.      Home Medications    Prior to Admission medications   Medication Sig Start Date End Date Taking? Authorizing Provider  fluticasone (FLOVENT HFA) 110 MCG/ACT inhaler Inhale into the lungs. 02/25/19 02/25/20 Yes [provider]  montelukast (SINGULAIR) 10 MG tablet Take 10 mg by mouth daily. 11/28/18  Yes [provider]  norethindrone-ethinyl estradiol-iron (ESTROSTEP FE,TILIA FE,TRI-LEGEST FE) 1-20/1-30/1-35 MG-MCG tablet Take 1 tablet by mouth daily.   Yes [provider]  PROAIR HFA 108 [phone] Base) MCG/ACT  inhaler INHALE 1 PUFF BY MOUTH EVERY 6 HOURS AS NEEDED FOR WHEEZING 03/29/19 08/29/19  Tommie Sams, DO    Family History Family History  Problem Relation Age of Onset  . Healthy Mother   . Healthy Father     Social History Social History   Tobacco Use  . Smoking status: Never Smoker  . Smokeless tobacco: Never Used  Substance Use Topics  . Alcohol use: No  . Drug use: No     Allergies   Pregabalin and Red dye   Review of Systems Review of Systems  Constitutional: Negative for appetite change, chills, fatigue and fever.  HENT: Negative for congestion.   Gastrointestinal: Positive for abdominal distention, abdominal pain and nausea. Negative for blood in stool, constipation, diarrhea and vomiting.  Genitourinary: Positive for vaginal discharge. Negative for dysuria, frequency, hematuria, menstrual problem and urgency.  Musculoskeletal: Negative for gait problem.  Skin: Negative for wound.  Neurological: Negative for weakness.  Hematological: Negative for adenopathy.   Physical Exam Triage Vital Signs ED Triage Vitals  Enc Vitals Group     BP 08/29/19 1522 128/83     Pulse Rate 08/29/19 1522 66     Resp 08/29/19 1522 18     Temp 08/29/19 1522 98.2 F (36.8 C)     Temp Source 08/29/19 1522 Oral     SpO2 08/29/19 1522 100 %     Weight 08/29/19 1518 130 lb 1.1 oz (59 kg)  Height 08/29/19 1518 5\' 2"  (1.575 m)     Head Circumference --      Peak Flow --      Pain Score 08/29/19 1518 6     Pain Loc --      Pain Edu? --      Excl. in GC? --    No data found.  Updated Vital Signs BP 128/83 (BP Location: Left Arm)   Pulse 66   Temp 98.2 F (36.8 C) (Oral)   Resp 18   Ht 5\' 2"  (1.575 m)   Wt 130 lb 1.1 oz (59 kg)   SpO2 100%   BMI 23.79 kg/m   Visual Acuity Right Eye Distance:   Left Eye Distance:   Bilateral Distance:    Right Eye Near:   Left Eye Near:    Bilateral Near:     Physical Exam Vitals and nursing note reviewed.  Constitutional:       General: She is not in acute distress.    Appearance: Normal appearance. She is not toxic-appearing.  HENT:     Right Ear: External ear normal.     Left Ear: External ear normal.  Eyes:     General: No scleral icterus.    Conjunctiva/sclera: Conjunctivae normal.  Pulmonary:     Effort: Pulmonary effort is normal.  Abdominal:     General: Abdomen is flat. Bowel sounds are normal.     Palpations: Abdomen is soft.     Tenderness: There is abdominal tenderness. There is guarding. There is no rebound.  Genitourinary:    Comments: PELVIC BIMANUAL ONLY- no tenderness noted with cervical motion, but upon palpation of RLQ/adnexal region she is tender.  Musculoskeletal:        General: Normal range of motion.     Cervical back: Neck supple.  Skin:    General: Skin is warm and dry.     Findings: No rash.  Neurological:     Mental Status: She is alert and oriented to person, place, and time.     Motor: No weakness.     Gait: Gait normal.  Psychiatric:        Mood and Affect: Mood normal.        Behavior: Behavior normal.        Thought Content: Thought content normal.        Judgment: Judgment normal.     UC Treatments / Results  Labs (all labs ordered are listed, but only abnormal results are displayed) Labs Reviewed  URINALYSIS, COMPLETE (UACMP) WITH MICROSCOPIC - Abnormal; Notable for the following components:      Result Value   Specific Gravity, Urine >1.030 (*)    Bacteria, UA RARE (*)    All other components within normal limits  PREGNANCY, URINE  CBC WITH DIFFERENTIAL/PLATELET  HCG, QUANTITATIVE, PREGNANCY    EKG   Radiology DG Abd 2 Views  Result Date: 08/29/2019 CLINICAL DATA:  Abdominal pain for 2 weeks EXAM: ABDOMEN - 2 VIEW COMPARISON:  None. FINDINGS: Non-obstructive bowel gas pattern. Colonic stool burden suggests constipation. No abnormal abdominal calcifications. No appendicolith. IMPRESSION: No acute findings. Possible constipation. Electronically Signed    By: M.D.   On: 08/29/2019 16:57    Procedures Procedures (including critical care time)  Medications Ordered in UC Medications - No data to display  Initial Impression / Assessment and Plan / UC Course  I have reviewed the triage vital signs and the nursing notes. Pertinent labs & imaging results  that were available during my care of the patient were reviewed by me and considered in my medical decision making (see chart for details). She was told her urine pregnancy test was negative and due to having large stool in cecum I would like to do a KUB and she was agreeable.  Her KUB shows a lot of stool worse in the ascending colon.  She was advised to drink Magnesium Citrate today to help her empty out colon. If symptom dont improve she needs to go to ER for abdominal CT or pelvic ultrasound.  See instructions.     Final Clinical Impressions(s) / UC Diagnoses   Final diagnoses:  Abdominal cramping  Constipation, unspecified constipation type     Discharge Instructions     Try Citric magnesium today to help  empty out fully. If your symptoms don't improve, then go to ER. Then to prevent future constipation try C.A.L.M. natural magnesium and take it at bed time.     ED Prescriptions    None     PDMP not reviewed this encounter.   Shelby Mattocks, Hershal Coria 08/29/19 2012

## 2019-08-29 NOTE — Discharge Instructions (Addendum)
Try Citric magnesium today to help  empty out fully. If your symptoms don't improve, then go to ER. Then to prevent future constipation try C.A.L.M. natural magnesium and take it at bed time.

## 2020-04-04 ENCOUNTER — Other Ambulatory Visit: Payer: Self-pay

## 2020-04-04 ENCOUNTER — Ambulatory Visit
Admission: EM | Admit: 2020-04-04 | Discharge: 2020-04-04 | Disposition: A | Discharge status: Home / Self Care | Payer: 59 | Attending: Family Medicine | Admitting: Family Medicine

## 2020-04-04 ENCOUNTER — Encounter: Payer: Self-pay | Admitting: Emergency Medicine

## 2020-04-04 DIAGNOSIS — Z20822 Contact with and (suspected) exposure to covid-19: Secondary | ICD-10-CM | POA: Diagnosis not present

## 2020-04-04 DIAGNOSIS — B349 Viral infection, unspecified: Secondary | ICD-10-CM | POA: Diagnosis not present

## 2020-04-04 MED ORDER — KETOROLAC TROMETHAMINE 10 MG PO TABS: 10.0000 mg | ORAL_TABLET | Freq: Four times a day (QID) | ORAL | 0 refills | Status: DC | PRN

## 2020-04-04 NOTE — ED Triage Notes (Signed)
Pt states that her partner tested positive for Covid yesterday. Pt states that she has sore throat and body aches, sx started Monday afternoon.

## 2020-04-04 NOTE — ED Provider Notes (Signed)
[facility]    CSN: [id number] Arrival date & time: 04/04/20  1701      History   Chief Complaint Chief Complaint  Patient presents with  . Sore Throat  . Generalized Body Aches  . Nasal Congestion   HPI  25 year old female presents with the above complaints.  2-day history of symptoms.  Patient reports that her significant other is positive for COVID-19.  She has had sore throat, congestion, and back pain.  Denies fever.  She otherwise feels well.  Desires COVID testing today.  No other complaints or concerns at this time.  Past Medical History:  Diagnosis Date  . Asthma   . RSD (reflex sympathetic dystrophy)    foot    Patient Active Problem List   Diagnosis Date Noted  . Abdominal pain, RLQ (right lower quadrant) 05/25/2018    Past Surgical History:  Procedure Laterality Date  . INTERCOSTAL NERVE BLOCK      OB History   No obstetric history on file.      Home Medications    Prior to Admission medications   Medication Sig Start Date End Date Taking? Authorizing Provider  ketorolac (TORADOL) 10 MG tablet Take 1 tablet (10 mg total) by mouth every 6 (six) hours as needed for moderate pain or severe pain. 04/04/20  Yes Saharah Sherrow G, DO  montelukast (SINGULAIR) 10 MG tablet Take 10 mg by mouth daily. 11/28/18  Yes [provider]  norethindrone-ethinyl estradiol-iron (ESTROSTEP FE,TILIA FE,TRI-LEGEST FE) 1-20/1-30/1-35 MG-MCG tablet Take 1 tablet by mouth daily.   Yes [provider]  fluticasone (FLOVENT HFA) 110 MCG/ACT inhaler Inhale into the lungs. 02/25/19 02/25/20  [provider]  PROAIR HFA 108 (90 Base) MCG/ACT inhaler INHALE 1 PUFF BY MOUTH EVERY 6 HOURS AS NEEDED FOR WHEEZING 03/29/19 08/29/19  Tommie Sams, DO    Family History Family History  Problem Relation Age of Onset  . Healthy Mother   . Healthy Father     Social History Social History   Tobacco Use  . Smoking status: Never Smoker  . Smokeless  tobacco: Never Used  Vaping Use  . Vaping Use: Never used  Substance Use Topics  . Alcohol use: No  . Drug use: No     Allergies   Pregabalin and Red dye   Review of Systems Review of Systems  Constitutional: Negative for fever.  HENT: Positive for congestion and sore throat.   Musculoskeletal: Positive for back pain.   Physical Exam Triage Vital Signs ED Triage Vitals  Enc Vitals Group     BP 04/04/20 1806 128/83     Pulse Rate 04/04/20 1806 69     Resp 04/04/20 1806 18     Temp 04/04/20 1806 98.3 F (36.8 C)     Temp Source 04/04/20 1806 Oral     SpO2 04/04/20 1806 100 %     Weight --      Height --      Head Circumference --      Peak Flow --      Pain Score 04/04/20 1804 0     Pain Loc --      Pain Edu? --      Excl. in GC? --    Updated Vital Signs BP 128/83   Pulse 69   Temp 98.3 F (36.8 C) (Oral)   Resp 18   SpO2 100%   Visual Acuity Right Eye Distance:   Left Eye Distance:   Bilateral Distance:  Right Eye Near:   Left Eye Near:    Bilateral Near:     Physical Exam Vitals and nursing note reviewed.  Constitutional:      General: She is not in acute distress.    Appearance: Normal appearance. She is not ill-appearing.  HENT:     Head: Normocephalic and atraumatic.  Eyes:     General:        Right eye: No discharge.        Left eye: No discharge.     Conjunctiva/sclera: Conjunctivae normal.  Cardiovascular:     Rate and Rhythm: Normal rate and regular rhythm.     Heart sounds: No murmur heard.   Pulmonary:     Effort: Pulmonary effort is normal.     Breath sounds: Normal breath sounds. No wheezing, rhonchi or rales.  Neurological:     Mental Status: She is alert.  Psychiatric:        Mood and Affect: Mood normal.        Behavior: Behavior normal.    UC Treatments / Results  Labs (all labs ordered are listed, but only abnormal results are displayed) Labs Reviewed  SARS CORONAVIRUS 2 (TAT 6-24 HRS)     EKG   Radiology No results found.  Procedures Procedures (including critical care time)  Medications Ordered in UC Medications - No data to display  Initial Impression / Assessment and Plan / UC Course  I have reviewed the triage vital signs and the nursing notes.  Pertinent labs & imaging results that were available during my care of the patient were reviewed by me and considered in my medical decision making (see chart for details).    25 year old female presents with viral illness.  Recent exposure to COVID-19.  Suspected COVID-19.  Awaiting test results.  Toradol for body aches.  Work note given.  Final Clinical Impressions(s) / UC Diagnoses   Final diagnoses:  Viral illness  Close exposure to COVID-19 virus     Discharge Instructions     Medication as prescribed.  Stay home.  Check my chart for COVID test results.  Take care  Dr. Adriana Simas    ED Prescriptions    Medication Sig Dispense Auth. Provider   ketorolac (TORADOL) 10 MG tablet Take 1 tablet (10 mg total) by mouth every 6 (six) hours as needed for moderate pain or severe pain. 20 tablet Tommie Sams, DO     PDMP not reviewed this encounter.   Tommie Sams, [place] 04/04/20 1954

## 2020-04-04 NOTE — Discharge Instructions (Signed)
Medication as prescribed.  Stay home.  Check my chart for COVID test results.  Take care  Dr. Issiac Jamar   

## 2020-04-05 LAB — SARS CORONAVIRUS 2 (TAT 6-24 HRS): SARS Coronavirus 2: NEGATIVE

## 2020-11-13 ENCOUNTER — Ambulatory Visit (INDEPENDENT_AMBULATORY_CARE_PROVIDER_SITE_OTHER): Payer: 59 | Admitting: Obstetrics & Gynecology

## 2020-11-13 ENCOUNTER — Encounter: Payer: Self-pay | Admitting: Obstetrics & Gynecology

## 2020-11-13 ENCOUNTER — Other Ambulatory Visit: Payer: Self-pay

## 2020-11-13 VITALS — BP 120/80 | Ht 62.0 in | Wt 134.0 lb

## 2020-11-13 DIAGNOSIS — N926 Irregular menstruation, unspecified: Secondary | ICD-10-CM

## 2020-11-13 DIAGNOSIS — R1030 Lower abdominal pain, unspecified: Secondary | ICD-10-CM | POA: Diagnosis not present

## 2020-11-13 NOTE — Patient Instructions (Signed)
Thank you for choosing [facility]. As part of our ongoing efforts to improve patient experience, we would appreciate your feedback. Please fill out the short survey that you will receive by mail or MyChart. Your opinion is important to [place]! -Dr Tiburcio Pea  Human Chorionic Gonadotropin Test Why am I having this test? A human chorionic gonadotropin (hCG) test is done to determine whether you are pregnant. It can also be used: To diagnose an abnormal pregnancy. To determine whether you have had a miscarriage or are at risk of one. What is being tested? This test checks the level of the human chorionic gonadotropin (hCG) hormone in the blood. This hormone is produced during pregnancy by the cells that form the placenta. The placenta is the organ that grows inside your uterus to nourish a developing baby. When you are pregnant, hCG can be detected in your blood or urine 7 to 8 days before your missed period. The amount of hCG continues toincrease for the first 8-10 weeks of pregnancy. The presence of hCG in your blood can be measured with different types of tests. You may have: A urine test. A urine test only shows whether there is hCG in your urine. It does not measure how much. A qualitative blood test. This blood test only shows whether there is hCG in your blood. It does not measure how much. A quantitative blood test. This type of blood test measures the amount of hCG in your blood. You may have this test to: Diagnose an abnormal pregnancy. Check whether you have had a miscarriage. Determine whether you are at risk of a miscarriage. Determine if treatment of an ectopic pregnancy is successful. What kind of sample is taken?     Two kinds of samples may be collected to test for the hCG hormone. Blood. It is usually collected by inserting a needle into a blood vessel. Urine. It is usually collected by urinating into a germ-free (sterile) specimen cup. How do I prepare for this test? No  preparation is needed for a blood test.  Some preparation is needed for a urine test: For best results, collect the sample the first time you urinate in the morning. That is when the concentration of hCG is highest. Do not drink too much fluid. Drink as you normally would, or as directed by your health care provider. Tell a health care provider about: All medicines you are taking, including vitamins, herbs, eye drops, creams, and over-the-counter medicines. Any blood in your urine. This may interfere with the result. How are the results reported? Depending on the type of test that you have, your test results may be reported as values. Your health care provider will compare your results to normal ranges that were established after testing a large group of people (reference ranges). Reference ranges may vary among labs and hospitals. For this test, common reference ranges that show absence of pregnancy are: Quantitative hCG blood levels: less than 5 IU/L. Other results will be reported as either positive or negative. For this test, normal results (meaning the absence of pregnancy) are: Negative for hCG in the urine test. Negative for hCG in the qualitative blood test. What do the results mean? Urine and qualitative blood test A negative result could mean: That you are not pregnant. That the test was done too early in your pregnancy to detect hCG in your blood or urine. If you still have other signs of pregnancy, the test will be repeated. A positive result means: That you are  most likely pregnant. Your health care provider may confirm your pregnancy with an ultrasound of your uterus, if needed. Quantitative blood test Results of the quantitative hCG blood test will be reported as values. These values will be interpreted by your health care provider along with your medical history and symptoms you are experiencing. Results outside of expected ranges could mean that: You are pregnant with  twins. You have abnormal growths in your uterus. You have an ectopic pregnancy. You may be experiencing a miscarriage. Talk with your health care provider about what your results mean. Questions to ask your health care provider Ask your health care provider, or the department that is doing the test: When will my results be ready? How will I get my results? What are my treatment options? What other tests do I need? What are my next steps? Summary A human chorionic gonadotropin (hCG) test is done to determine whether you are pregnant. When you are pregnant, hCG can be detected in your blood or urine 7 to 8 days before your missed period. HCG levels continue to go up for the first 8-10 weeks of pregnancy. Your hCG level can be measured with different types of tests. You may have a urine test, a qualitative blood test, or a quantitative blood test. Talk with your health care provider about what your test results mean. This information is not intended to replace advice given to you by your health care provider. Make sure you discuss any questions you have with your healthcare provider. Document Revised: 12/12/2019 Document Reviewed: 12/12/2019 Elsevier Patient Education  2022 [employer].

## 2020-11-13 NOTE — Progress Notes (Signed)
Obstetric Problem Visit    Chief Complaint  Patient presents with   Abdominal Cramping    History of Present Illness: Patient is a 25 y.o. G1P0 w LMP 09/27/20 and beta hCG on 11/02/20 of 59 at PCP, with 2 week h/o lower abdominal cramping; intermittent and mild.  No BLEEDING.  She also has nausea, breast T, constipation (chronic), and LBP.  PMHx: She  has a past medical history of Asthma and RSD (reflex sympathetic dystrophy). Also,  has a past surgical history that includes Intercostal nerve block., family history includes Healthy in her father and mother.,  reports that she has never smoked. She has never used smokeless tobacco. She reports that she does not drink alcohol and does not use drugs.  She has a current medication list which includes the following prescription(s): albuterol, albuterol, montelukast, trazodone, and fluticasone. Also, is allergic to pregabalin and red dye.  Review of Systems  Constitutional:  Positive for malaise/fatigue. Negative for chills and fever.  HENT:  Negative for congestion, sinus pain and sore throat.   Eyes:  Negative for blurred vision and pain.  Respiratory:  Negative for cough and wheezing.   Cardiovascular:  Negative for chest pain and leg swelling.  Gastrointestinal:  Positive for abdominal pain, constipation, nausea and vomiting. Negative for diarrhea and heartburn.  Genitourinary:  Positive for frequency. Negative for dysuria, hematuria and urgency.  Musculoskeletal:  Negative for back pain, joint pain, myalgias and neck pain.  Skin:  Negative for itching and rash.  Neurological:  Negative for dizziness, tremors and weakness.  Endo/Heme/Allergies:  Does not bruise/bleed easily.  Psychiatric/Behavioral:  Negative for depression. The patient is not nervous/anxious and does not have insomnia.    Objective: Vitals:   11/13/20 0833  BP: 120/80   Physical Exam Constitutional:      General: She is not in acute distress.    Appearance: She is  well-developed.  Genitourinary:     Right Labia: No rash or tenderness.    Left Labia: No tenderness or rash.    No vaginal erythema or bleeding.      Right Adnexa: not tender and no mass present.    Left Adnexa: not tender and no mass present.    No cervical motion tenderness, discharge, polyp or nabothian cyst.     Uterus is not enlarged.     No uterine mass detected.    Pelvic exam was performed with patient in the lithotomy position.  HENT:     Head: Normocephalic and atraumatic.     Nose: Nose normal.  Abdominal:     General: There is no distension.     Palpations: Abdomen is soft.     Tenderness: There is no abdominal tenderness.  Musculoskeletal:        General: Normal range of motion.  Neurological:     Mental Status: She is alert and oriented to person, place, and time.     Cranial Nerves: No cranial nerve deficit.  Skin:    General: Skin is warm and dry.  Psychiatric:        Attention and Perception: Attention normal.        Mood and Affect: Mood and affect normal.        Speech: Speech normal.        Behavior: Behavior normal.        Thought Content: Thought content normal.        Judgment: Judgment normal.    Assessment: 25 y.o. G1P0, LMP 09/27/20  1. Missed period 2. Lower abdominal pain - [place] today, IUP w amniotic fluid, no fetal pole (gest sac only) This may be due to gestational age No sign of ectopic, cyst, hemorrhage  Plan: 1) First trimester pain, no bleeding  Labs and ultrasound follow up There is no clearly documented benefit to limiting or modifying activity and sexual intercourse in altering clinic course of  pain or potential for 1st trimester bleeding.    2) If not already done will proceed with TVUS evaluation to document viability, and if uncertain viability or absence of a demonstrable IUP (and no previous documentation of IUP) will trend HCG levels.  3) The patient is Rh unknown, rhogam is therefore not yet indicated to decrease the risk  rhesus alloimmunization.  Will check labs today  4) Routine first trimester precautions were discussed with the patient prior the conclusion of today's visit.  Annamarie Major, MD, [facility], [facility] 11/13/2020  9:28 AM

## 2020-11-13 NOTE — Progress Notes (Signed)
ULTRASOUND REPORT  Location: [facility] Date of Service: 11/13/2020   Indications: Missed period and lower abdominal pain Findings:  Mason Jim intrauterine pregnancy is visualized with no CRL seen although gestation sac is symmetrical, no fetal pole or yolk sac seen. Long closed cervix.  Right Ovary is normal in appearance. Left Ovary is normal appearance. Corpus luteal cyst:  is not visualized Survey of the adnexa demonstrates no adnexal masses. There is no free peritoneal fluid in the cul de sac.  Impression: 1.  Intrauterine gestational sac  by U/S. 2. (U/S) EDD is unclear at this time. 3. By LMP, she is 6 5/[redacted] weeks EGA today; w EDC of 07/04/21.  Recommendations: 1.Clinical correlation with the patient's History and Physical Exam. 2. Beta hCG levels to trend 3. Follow up ultrasound based on lab results  Letitia Libra, MD

## 2020-11-14 ENCOUNTER — Other Ambulatory Visit: Payer: Self-pay | Admitting: Obstetrics & Gynecology

## 2020-11-14 ENCOUNTER — Telehealth: Payer: Self-pay

## 2020-11-14 DIAGNOSIS — N926 Irregular menstruation, unspecified: Secondary | ICD-10-CM

## 2020-11-14 DIAGNOSIS — R1030 Lower abdominal pain, unspecified: Secondary | ICD-10-CM

## 2020-11-14 LAB — ABO AND RH: Rh Factor: POSITIVE

## 2020-11-14 LAB — BETA HCG QUANT (REF LAB): hCG Quant: 3753 m[IU]/mL

## 2020-11-14 NOTE — Progress Notes (Signed)
Beta hCG reviewed (2263) Recommend repeat after 48 hours, as [place] did not show more than a gest sac yesterday (same day has lab). LM to d/w pt.  Annamarie Major, MD, [facility], [facility] 11/14/2020  7:56 AM

## 2020-11-14 NOTE — Telephone Encounter (Signed)
Called and left voicemail for patient to call back to be scheduled. 

## 2020-11-14 NOTE — Progress Notes (Signed)
Pt will call you to sch lab  appt thurs  D/w pt results of recent beta hCG and need to repeat after 48 hours Anticipate scheduling ultrasound next week

## 2020-11-14 NOTE — Telephone Encounter (Signed)
-----   Message from Nadara Mustard, MD sent at 11/14/2020  8:59 AM EDT ----- Pt will call you to sch lab  appt thurs  D/w pt results of recent beta hCG and need to repeat after 48 hours Anticipate scheduling ultrasound next week

## 2020-11-15 ENCOUNTER — Other Ambulatory Visit: Payer: Self-pay

## 2020-11-15 ENCOUNTER — Other Ambulatory Visit: Payer: 59

## 2020-11-15 DIAGNOSIS — N926 Irregular menstruation, unspecified: Secondary | ICD-10-CM

## 2020-11-15 NOTE — Telephone Encounter (Signed)
Patient is scheduled 11/15/20 for labs

## 2020-11-16 LAB — BETA HCG QUANT (REF LAB): hCG Quant: 6940 m[IU]/mL

## 2020-11-20 ENCOUNTER — Ambulatory Visit
Admission: RE | Admit: 2020-11-20 | Discharge: 2020-11-20 | Disposition: A | Discharge status: Home / Self Care | Payer: 59 | Source: Ambulatory Visit | Attending: Obstetrics & Gynecology | Admitting: Obstetrics & Gynecology

## 2020-11-20 ENCOUNTER — Telehealth: Payer: Self-pay

## 2020-11-20 ENCOUNTER — Other Ambulatory Visit: Payer: Self-pay

## 2020-11-20 ENCOUNTER — Other Ambulatory Visit: Payer: Self-pay | Admitting: Obstetrics & Gynecology

## 2020-11-20 DIAGNOSIS — N926 Irregular menstruation, unspecified: Secondary | ICD-10-CM | POA: Diagnosis not present

## 2020-11-20 DIAGNOSIS — R1030 Lower abdominal pain, unspecified: Secondary | ICD-10-CM | POA: Insufficient documentation

## 2020-11-20 NOTE — Telephone Encounter (Signed)
Pt calling; having bad nausea; NOB on the 31st.  [phone]  Adv pt vitamin B6 10-24mg  q8h, unisome 25mg  at bedtime and 12.5mg  in am; ginger drops, nausea suckers, sea bands

## 2020-11-21 ENCOUNTER — Encounter: Payer: Self-pay | Admitting: Obstetrics & Gynecology

## 2020-11-21 ENCOUNTER — Ambulatory Visit (INDEPENDENT_AMBULATORY_CARE_PROVIDER_SITE_OTHER): Payer: 59 | Admitting: Obstetrics & Gynecology

## 2020-11-21 VITALS — BP 120/80 | Ht 62.0 in | Wt 134.0 lb

## 2020-11-21 DIAGNOSIS — N926 Irregular menstruation, unspecified: Secondary | ICD-10-CM

## 2020-11-21 DIAGNOSIS — R1030 Lower abdominal pain, unspecified: Secondary | ICD-10-CM

## 2020-11-21 NOTE — Patient Instructions (Signed)
Due Date 07/15/2021  First Trimester of Pregnancy The first trimester of pregnancy starts on the first day of your last menstrual period until the end of week 12. This is months 1 through 3 of pregnancy. A week after a sperm fertilizes an egg, the egg will implant into the wall of the uterus and begin to develop into a baby. By the end of 12 weeks, all the baby's organs will be formed and the baby will be 2-3 inches in size. Body changes during your first trimester Your body goes through many changes during pregnancy. The changes vary and generally return to normal after your baby is born. Physical changes You may gain or lose weight. Your breasts may begin to grow larger and become tender. The tissue that surrounds your nipples (areola) may become darker. Dark spots or blotches (chloasma or mask of pregnancy) may develop on your face. You may have changes in your hair. These can include thickening or thinning of your hair or changes in texture. Health changes You may feel nauseous, and you may vomit. You may have heartburn. You may develop headaches. You may develop constipation. Your gums may bleed and may be sensitive to brushing and flossing. Other changes You may tire easily. You may urinate more often. Your menstrual periods will stop. You may have a loss of appetite. You may develop cravings for certain kinds of food. You may have changes in your emotions from day to day. You may have more vivid and strange dreams. Follow these instructions at home: Medicines Follow your health care provider's instructions regarding medicine use. Specific medicines may be either safe or unsafe to take during pregnancy. Do not take any medicines unless told to by your health care provider. Take a prenatal vitamin that contains at least 600 micrograms (mcg) of folic acid. Eating and drinking Eat a healthy diet that includes fresh fruits and vegetables, whole grains, good sources of protein such as  meat, eggs, or tofu, and low-fat dairy products. Avoid raw meat and unpasteurized juice, milk, and cheese. These carry germs that can harm you and your baby. If you feel nauseous or you vomit: Eat 4 or 5 small meals a day instead of 3 large meals. Try eating a few soda crackers. Drink liquids between meals instead of during meals. You may need to take these actions to prevent or treat constipation: Drink enough fluid to keep your urine pale yellow. Eat foods that are high in fiber, such as beans, whole grains, and fresh fruits and vegetables. Limit foods that are high in fat and processed sugars, such as fried or sweet foods. Activity Exercise only as directed by your health care provider. Most people can continue their usual exercise routine during pregnancy. Try to exercise for 30 minutes at least 5 days a week. Stop exercising if you develop pain or cramping in the lower abdomen or lower back. Avoid exercising if it is very hot or humid or if you are at high altitude. Avoid heavy lifting. If you choose to, you may have sex unless your health care provider tells you not to. Relieving pain and discomfort Wear a good support bra to relieve breast tenderness. Rest with your legs elevated if you have leg cramps or low back pain. If you develop bulging veins (varicose veins) in your legs: Wear support hose as told by your health care provider. Elevate your feet for 15 minutes, 3-4 times a day. Limit salt in your diet. Safety Wear your seat belt at  all times when driving or riding in a car. Talk with your health care provider if someone is verbally or physically abusive to you. Talk with your health care provider if you are feeling sad or have thoughts of hurting yourself. Lifestyle Do not use hot tubs, steam rooms, or saunas. Do not douche. Do not use tampons or scented sanitary pads. Do not use herbal remedies, alcohol, illegal drugs, or medicines that are not approved by your health care  provider. Chemicals in these products can harm your baby. Do not use any products that contain nicotine or tobacco, such as cigarettes, e-cigarettes, and chewing tobacco. If you need help quitting, ask your health care provider. Avoid cat litter boxes and soil used by cats. These carry germs that can cause birth defects in the baby and possibly loss of the unborn baby (fetus) by miscarriage or stillbirth. General instructions During routine prenatal visits in the first trimester, your health care provider will do a physical exam, perform necessary tests, and ask you how things are going. Keep all follow-up visits. This is important. Ask for help if you have counseling or nutritional needs during pregnancy. Your health care provider can offer advice or refer you to specialists for help with various needs. Schedule a dentist appointment. At home, brush your teeth with a soft toothbrush. Floss gently. Write down your questions. Take them to your prenatal visits. Where to find more information American Pregnancy Association: americanpregnancy.org [employer]: https://www.todd-brady.net/ Office on [employer] Health: [employer] Contact a health care provider if you have: Dizziness. A fever. Mild pelvic cramps, pelvic pressure, or nagging pain in the abdominal area. Nausea, vomiting, or diarrhea that lasts for 24 hours or longer. A bad-smelling vaginal discharge. Pain when you urinate. Known exposure to a contagious illness, such as chickenpox, measles, Zika virus, HIV, or hepatitis. Get help right away if you have: Spotting or bleeding from your vagina. Severe abdominal cramping or pain. Shortness of breath or chest pain. Any kind of trauma, such as from a fall or a car crash. New or increased pain, swelling, or redness in an arm or leg. Summary The first trimester of pregnancy starts on the first day of your last menstrual period  until the end of week 12 (months 1 through 3). Eating 4 or 5 small meals a day rather than 3 large meals may help to relieve nausea and vomiting. Do not use any products that contain nicotine or tobacco, such as cigarettes, e-cigarettes, and chewing tobacco. If you need help quitting, ask your health care provider. Keep all follow-up visits. This is important. This information is not intended to replace advice given to you by your health care provider. Make sure you discuss any questions you have with your health care provider. Document Revised: 08/17/2019 Document Reviewed: 06/23/2019 Elsevier Patient Education  2022 [employer].

## 2020-11-21 NOTE — Progress Notes (Signed)
  HPI: Pt has mild nausea and no pain or bleeding She is here for f/u to [place] Initial [place] was unclear as to pregnancy  Ultrasound demonstrates IUP with CRL c/w 6 1/[redacted] weeks EGA These findings are Pelvis improved from prior studies  PMHx: She  has a past medical history of Asthma and RSD (reflex sympathetic dystrophy). Also,  has a past surgical history that includes Intercostal nerve block., family history includes Healthy in her father and mother.,  reports that she has never smoked. She has never used smokeless tobacco. She reports that she does not drink alcohol and does not use drugs.  She has a current medication list which includes the following prescription(s): albuterol, montelukast, trazodone, albuterol, and fluticasone. Also, is allergic to pregabalin and red dye.  ROS  Objective: BP 120/80   Ht 5\' 2"  (1.575 m)   Wt 134 lb (60.8 kg)   BMI 24.51 kg/m   Physical examination Constitutional NAD, Conversant  Skin No rashes, lesions or ulceration.   Extremities: Moves all appropriately.  Normal ROM for age. No lymphadenopathy.  Neuro: Grossly intact  Psych: Oriented to PPT.  Normal mood. Normal affect.   OB LESS THAN 14 WEEKS WITH OB TRANSVAGINAL  Result Date: 11/21/2020 CLINICAL DATA:  Dating, cramping EXAM: OBSTETRIC <14 WK 11/23/2020 AND TRANSVAGINAL OB US TECHNIQUE: Both transabdominal and transvaginal ultrasound examinations were performed for complete evaluation of the gestation as well as the maternal uterus, adnexal regions, and pelvic cul-de-sac. Transvaginal technique was performed to assess early pregnancy. COMPARISON:  None. FINDINGS: Intrauterine gestational sac: Single Yolk sac:  Visualized. Embryo:  Visualized. Cardiac Activity: Visualized. Heart Rate: 115 bpm MSD:   mm    w     d CRL:  4.6 mm   6 w   1 d                  [place] EDC: 07/15/2021 Subchorionic hemorrhage:  None visualized. Maternal uterus/adnexae: No adnexal mass or free fluid. IMPRESSION: Six week 1 day  intrauterine pregnancy. Fetal heart rate 150 beats per minute. No acute maternal findings. Electronically Signed   By: 07/17/2021 M.D.   On: 11/21/2020 10:38    Assessment:  Missed period Lower abdominal pain, improved First trimester pregnancy, 6 weeks  Prenatal care questions answered Diet, activity, work related concerns addressed Plan NOB soon She plans for NIPT  A total of 20 minutes were spent face-to-face with the patient as well as preparation, review, communication, and documentation during this encounter.   11/23/2020, MD, [facility], [facility] 11/21/2020  11:20 AM

## 2020-11-30 ENCOUNTER — Encounter: Payer: Self-pay | Admitting: Advanced Practice Midwife

## 2020-12-10 ENCOUNTER — Other Ambulatory Visit: Payer: Self-pay

## 2020-12-10 ENCOUNTER — Other Ambulatory Visit (HOSPITAL_COMMUNITY)
Admission: RE | Admit: 2020-12-10 | Discharge: 2020-12-10 | Disposition: A | Discharge status: Home / Self Care | Payer: 59 | Source: Ambulatory Visit | Attending: Advanced Practice Midwife | Admitting: Advanced Practice Midwife

## 2020-12-10 ENCOUNTER — Encounter: Payer: Self-pay | Admitting: Advanced Practice Midwife

## 2020-12-10 ENCOUNTER — Ambulatory Visit (INDEPENDENT_AMBULATORY_CARE_PROVIDER_SITE_OTHER): Payer: 59 | Admitting: Advanced Practice Midwife

## 2020-12-10 VITALS — BP 120/80 | Wt 134.0 lb

## 2020-12-10 DIAGNOSIS — Z369 Encounter for antenatal screening, unspecified: Secondary | ICD-10-CM | POA: Insufficient documentation

## 2020-12-10 DIAGNOSIS — Z113 Encounter for screening for infections with a predominantly sexual mode of transmission: Secondary | ICD-10-CM

## 2020-12-10 DIAGNOSIS — Z3A09 9 weeks gestation of pregnancy: Secondary | ICD-10-CM

## 2020-12-10 DIAGNOSIS — Z1159 Encounter for screening for other viral diseases: Secondary | ICD-10-CM

## 2020-12-10 DIAGNOSIS — Z3401 Encounter for supervision of normal first pregnancy, first trimester: Secondary | ICD-10-CM | POA: Diagnosis not present

## 2020-12-10 DIAGNOSIS — Z3490 Encounter for supervision of normal pregnancy, unspecified, unspecified trimester: Secondary | ICD-10-CM | POA: Insufficient documentation

## 2020-12-10 DIAGNOSIS — G9059 Complex regional pain syndrome I of other specified site: Secondary | ICD-10-CM | POA: Insufficient documentation

## 2020-12-10 LAB — POCT URINALYSIS DIPSTICK OB
Glucose, UA: NEGATIVE
POC,PROTEIN,UA: NEGATIVE

## 2020-12-10 NOTE — Patient Instructions (Signed)

## 2020-12-10 NOTE — Progress Notes (Signed)
New Obstetric Patient H&P    Chief Complaint: "Desires prenatal care"   History of Present Illness: Patient is a 25 y.o. G1P0 Not Hispanic or Latino female, presents with amenorrhea and positive home pregnancy test. Patient's last menstrual period was 09/27/2020. and based on 6 week ultrasound, her EDD is Estimated Date of Delivery: 07/15/21 and her EGA is [redacted]w[redacted]d. Cycles are irregular in frequency and length and are usually heavy flow. Her last pap smear was 1 years ago and was no abnormalities.    She had a urine pregnancy test which was positive 5 week(s)  ago. Since her LMP she claims she has experienced breast tenderness, fatigue, nausea. She denies vaginal bleeding. Her past medical history is contributory RSD (reflex sympathetic dystrophy)/complex regional pain syndrome.  Since her LMP, she admits to the use of tobacco products  no She claims she has gained no pounds since the start of her pregnancy.  There are cats in the home in the home  no She admits close contact with children on a regular basis  no  She has had chicken pox in the past yes She has had Tuberculosis exposures, symptoms, or previously tested positive for TB   no Current or past history of domestic violence. no  Genetic Screening/Teratology Counseling: (Includes patient, baby's father, or anyone in either family with:)   1. Patient's age >/= 45 at [facility]  no 2. Thalassemia ([place], [place], Mediterranean, or Asian background): MCV<80  no 3. Neural tube defect (meningomyelocele, spina bifida, anencephaly)  no 4. Congenital heart defect  no  5. Down syndrome  no 6. Tay-Sachs (Jewish, [place]  no 7. Canavan's Disease  no 8. Sickle cell disease or trait (African)  no  9. Hemophilia or other blood disorders  no  10. Muscular dystrophy  no  11. Cystic fibrosis  no  12. Huntington's Chorea  no  13. Mental retardation/autism  no 14. Other inherited genetic or chromosomal disorder  no 15. Maternal metabolic  disorder (DM, PKU, etc)  no 16. Patient or FOB with a child with a birth defect not listed above no  16a. Patient or FOB with a birth defect themselves no 17. Recurrent pregnancy loss, or stillbirth  no  18. Any medications since LMP other than prenatal vitamins (include vitamins, supplements, OTC meds, drugs, alcohol)  no 19. 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 She uses protection/shields. Discussed ideally limiting exposure. Work note written.  Infection History:   1. Lives with someone with TB or TB exposed  no  2. Patient or partner has history of genital herpes  no 3. Rash or viral illness since LMP  no 4. History of STI (GC, CT, HPV, syphilis, HIV)  no 5. History of recent travel :  no  Other pertinent information:  no     Review of Systems:10 point review of systems negative unless otherwise noted in HPI  Past Medical History:  Patient Active Problem List   Diagnosis Date Noted   Complex regional pain syndrome I of other specified site 12/10/2020   Supervision of normal pregnancy 12/10/2020     Nursing Staff Provider  Office Location  [facility] Dating    Language  English Anatomy US    Flu Vaccine   Genetic Screen  NIPS:   TDaP vaccine    Hgb A1C or  GTT Early : NA Third trimester :   Covid    LAB RESULTS  Rhogam   Blood Type O/Positive/-- (08/23 4315)   Feeding Plan Breast Antibody    Contraception  Rubella    Circumcision  RPR     Pediatrician   HBsAg     Support Person Husband Matt HIV    Prenatal Classes  Varicella     GBS  (For PCN allergy, check sensitivities)   BTL Consent     VBAC Consent  Pap 2021 negative     Hgb Electro    Pelvis Tested  CF      SMA            Reactive airway disease 01/28/2019   RSD (reflex sympathetic dystrophy) 06/04/2011    Past Surgical History:  Past Surgical History:  Procedure Laterality Date   INTERCOSTAL NERVE BLOCK      Gynecologic  History: Patient's last menstrual period was 09/27/2020.  Obstetric History: G1P0  Family History:  Family History  Problem Relation Age of Onset   Healthy Mother    Healthy Father     Social History:  Social History   Socioeconomic History   Marital status: Married    Spouse name: Not on file   Number of children: Not on file   Years of education: Not on file   Highest education level: Not on file  Occupational History   Not on file  Tobacco Use   Smoking status: Never   Smokeless tobacco: Never  Vaping Use   Vaping Use: Never used  Substance and Sexual Activity   Alcohol use: No   Drug use: No   Sexual activity: Not on file  Other Topics Concern   Not on file  Social History Narrative   Not on file   Social Determinants of Health   Financial Resource Strain: Not on file  Food Insecurity: Not on file  Transportation Needs: Not on file  Physical Activity: Not on file  Stress: Not on file  Social Connections: Not on file  Intimate Partner Violence: Not on file    Allergies:  Allergies  Allergen Reactions   Pregabalin     Other reaction(s): Hallucinations   Red Dye Rash    Medications: Prior to Admission medications   Medication Sig Start Date End Date Taking? Authorizing Provider  montelukast (SINGULAIR) 10 MG tablet Take 10 mg by mouth daily. 11/28/18  Yes [provider]  traZODone (DESYREL) 50 MG tablet Take 1/2 to 1 tab at bedtime prn 10/29/20  Yes [provider]  Cholecalciferol 50 MCG (2000 UT) CAPS Take by mouth.    [provider]  fluticasone (FLOVENT HFA) 110 MCG/ACT inhaler Inhale into the lungs. 02/25/19 02/25/20  [provider]  Prenatal w/o A Vit-Fe Fum-FA (PRENATAL VITAMIN W/FE, FA) 29-1 MG CHEW     [provider]    Physical Exam Vitals: Blood pressure 120/80, weight 134 lb (60.8 kg), last menstrual period 09/27/2020.  General: NAD HEENT: normocephalic, anicteric Thyroid: no enlargement, no  palpable nodules Pulmonary: No increased work of breathing, CTAB Cardiovascular: RRR, distal pulses 2+ Abdomen: NABS, soft, non-tender, non-distended.  Umbilicus without lesions.  No hepatomegaly, splenomegaly or masses palpable. No evidence of hernia  Genitourinary:  External: Normal external female genitalia.  Normal urethral meatus, normal  Bartholin's and Skene's glands.    Vagina: Normal vaginal mucosa, no evidence of prolapse.   Extremities: no edema, erythema, or tenderness Neurologic: Grossly intact Psychiatric: mood appropriate, affect full   The following were addressed during this visit:  First OB Visit -  Record Pre-Pregnancy Weight  - Initial Domestic Abuse Screen   Breastfeeding Education - Early initiation of breastfeeding    Comments: Keeps milk supply adequate, helps contract uterus and slow bleeding, and early milk is the perfect first food and is easy to digest.   - The importance of exclusive breastfeeding    Comments: Provides antibodies, Lower risk of breast and ovarian cancers, and type-2 diabetes,Helps your body recover, Reduced chance of SIDS.   - Risks of giving your baby anything other than breast milk if you are breastfeeding    Comments: Make the baby less content with breastfeeds, may make my baby more susceptible to illness, and may reduce my milk supply.   - The importance of early skin-to-skin contact    Comments:  Keeps baby warm and secure, helps keep baby's blood sugar up and breathing steady, easier to bond and breastfeed, and helps calm baby.  - Rooming-in on a 24-hour basis    Comments: Easier to learn baby's feeding cues, easier to bond and get to know each other, and encourages milk production.   - Feeding on demand or baby-led feeding    Comments: Helps prevent breastfeeding complications, helps bring in good milk supply, prevents under or overfeeding, and helps baby feel content and satisfied   - Frequent feeding to help assure  optimal milk production    Comments: Making a full supply of milk requires frequent removal of milk from breasts, infant will eat 8-12 times in 24 hours, if separated from infant use breast massage, hand expression and/ or pumping to remove milk from breasts.   - Effective positioning and attachment    Comments: Helps my baby to get enough breast milk, helps to produce an adequate milk supply, and helps prevent nipple pain and damage   - Exclusive breastfeeding for the first 6 months    Comments: Builds a healthy milk supply and keeps it up, protects baby from sickness and disease, and breastmilk has everything your baby needs for the first 6 months.   Assessment: 25 y.o. G1P0 at [redacted]w[redacted]d presenting to initiate prenatal care  Plan: 1) Avoid alcoholic beverages. 2) Patient encouraged not to smoke.  3) Discontinue the use of all non-medicinal drugs and chemicals.  4) Take prenatal vitamins daily.  5) Nutrition, food safety (fish, cheese advisories, and high nitrite foods) and exercise discussed. 6) Hospital and practice style discussed with cross coverage system.  7) Genetic Screening, such as with 1st Trimester Screening, cell free fetal DNA, AFP testing, and Ultrasound, as well as with amniocentesis and CVS as appropriate, is discussed with patient. At the conclusion of today's visit patient requested genetic testing 8) Patient is asked about travel to areas at risk for the Zika virus, and counseled to avoid travel and exposure to mosquitoes or sexual partners who may have themselves been exposed to the virus. Testing is discussed, and will be ordered as appropriate.  9) Aptima, urine culture today 10) Return to clinic in 1 week for viability scan, labs including MaterniT 21 and ROB   Tresea Mall, CNM [facility] 12/10/2020, 11:58 AM

## 2020-12-12 LAB — CERVICOVAGINAL ANCILLARY ONLY
Chlamydia: NEGATIVE
Comment: NEGATIVE
Comment: NEGATIVE
Comment: NORMAL
Neisseria Gonorrhea: NEGATIVE
Trichomonas: NEGATIVE

## 2020-12-13 LAB — URINE CULTURE

## 2020-12-21 ENCOUNTER — Other Ambulatory Visit: Payer: Self-pay

## 2020-12-21 ENCOUNTER — Encounter: Payer: Self-pay | Admitting: Obstetrics & Gynecology

## 2020-12-21 ENCOUNTER — Ambulatory Visit (INDEPENDENT_AMBULATORY_CARE_PROVIDER_SITE_OTHER): Payer: 59 | Admitting: Obstetrics & Gynecology

## 2020-12-21 VITALS — BP 120/80 | Wt 134.0 lb

## 2020-12-21 DIAGNOSIS — Z0189 Encounter for other specified special examinations: Secondary | ICD-10-CM

## 2020-12-21 DIAGNOSIS — Z1379 Encounter for other screening for genetic and chromosomal anomalies: Secondary | ICD-10-CM

## 2020-12-21 DIAGNOSIS — Z3401 Encounter for supervision of normal first pregnancy, first trimester: Secondary | ICD-10-CM

## 2020-12-21 DIAGNOSIS — Z113 Encounter for screening for infections with a predominantly sexual mode of transmission: Secondary | ICD-10-CM

## 2020-12-21 DIAGNOSIS — Z3A10 10 weeks gestation of pregnancy: Secondary | ICD-10-CM

## 2020-12-21 DIAGNOSIS — Z369 Encounter for antenatal screening, unspecified: Secondary | ICD-10-CM

## 2020-12-21 DIAGNOSIS — Z1159 Encounter for screening for other viral diseases: Secondary | ICD-10-CM

## 2020-12-21 NOTE — Progress Notes (Signed)
  Subjective  Min nausea w Unisom No pain or VB  Objective  BP 120/80   Wt 134 lb (60.8 kg)   LMP 09/27/2020   BMI 24.51 kg/m  General: NAD Pumonary: no increased work of breathing Abdomen: gravid, non-tender Extremities: no edema Psychiatric: mood appropriate, affect full  Assessment  25 y.o. G1P0 at [redacted]w[redacted]d by  07/15/2021, by Ultrasound presenting for routine prenatal visit  Plan   Problem List Items Addressed This Visit      Other   Supervision of normal pregnancy - Primary  Other Visit Diagnoses    [redacted] weeks gestation of pregnancy       Encounter for genetic screening for Down Syndrome       Relevant Orders   MaterniT21 PLUS Core+SCA   Screen for sexually transmitted diseases       Antenatal screening encounter       Need for hepatitis C screening test       Encounter for imaging to assess myocardial viability        [place] today, good FHT and growth PNV  pregnancy1  Problems (from 12/10/20 to present)    Problem Noted Resolved   Supervision of normal pregnancy 12/10/2020 by Tresea Mall, CNM No   Overview Signed 12/10/2020 10:40 AM by Tresea Mall, CNM     Nursing Staff Provider  Office Location  [facility] Dating    Language  English Anatomy US    Flu Vaccine   Genetic Screen  NIPS:   TDaP vaccine    Hgb A1C or  GTT Early : NA Third trimester :   Covid    LAB RESULTS   Rhogam   Blood Type O/Positive/-- (08/23 0942)   Feeding Plan Breast Antibody    Contraception  Rubella    Circumcision  RPR     Pediatrician   HBsAg     Support Person Husband Matt HIV    Prenatal Classes  Varicella     GBS  (For PCN allergy, check sensitivities)   BTL Consent     VBAC Consent  Pap 2021 negative     Hgb Electro    Pelvis Tested  CF      SMA                   Annamarie Major, MD, [facility], [facility] 12/21/2020  11:07 AM

## 2020-12-21 NOTE — Patient Instructions (Signed)

## 2020-12-21 NOTE — Progress Notes (Signed)
ULTRASOUND REPORT  Location: [facility] Date of Service: 12/21/2020   Indications: Viability evaluation Findings:  Mason Jim intrauterine pregnancy is visualized with a CRL consistent with [redacted]w[redacted]d gestation, giving an (U/S) EDD of 07/14/21. The (U/S) EDD is consistent with the clinically established EDD of 07/15/21.  FHR: 150 BPM CRL measurement: 36.4 mm Yolk sac is not visualized. Amnion: visualized and appears normal   Right Ovary is normal in appearance. Left Ovary is normal appearance. Corpus luteal cyst:  is not visualized Survey of the adnexa demonstrates no adnexal masses. There is no free peritoneal fluid in the cul de sac.  Impression: 1. [redacted]w[redacted]d Viable Singleton Intrauterine pregnancy by U/S. 2. (U/S) EDD is consistent with Clinically established EDD of 07/15/21.  Recommendations: 1.Clinical correlation with the patient's History and Physical Exam. 2. No change in EDC 3.  Reassuring viability  Letitia Libra, MD

## 2020-12-22 LAB — RPR+RH+ABO+RUB AB+AB SCR+CB...
Antibody Screen: NEGATIVE
HIV Screen 4th Generation wRfx: NONREACTIVE
Hematocrit: 36.8 % (ref 34.0–46.6)
Hemoglobin: 12.3 g/dL (ref 11.1–15.9)
Hepatitis B Surface Ag: NEGATIVE
MCH: 29.8 pg (ref 26.6–33.0)
MCHC: 33.4 g/dL (ref 31.5–35.7)
MCV: 89 fL (ref 79–97)
Platelets: 256 10*3/uL (ref 150–450)
RBC: 4.13 x10E6/uL (ref 3.77–5.28)
RDW: 12.2 % (ref 11.7–15.4)
RPR Ser Ql: NONREACTIVE
Rh Factor: POSITIVE
Rubella Antibodies, IGG: 4.17 index (ref >0.99)
Varicella zoster IgG: 861 index (ref >165)
WBC: 7.3 10*3/uL (ref 3.4–10.8)

## 2020-12-22 LAB — HEPATITIS C ANTIBODY: Hep C Virus Ab: <0.1 s/co ratio (ref 0.0–0.9)

## 2020-12-28 LAB — MATERNIT21 PLUS CORE+SCA
Fetal Fraction: 8
Monosomy X (Turner Syndrome): NOT DETECTED
Result (T21): NEGATIVE
Trisomy 13 (Patau syndrome): NEGATIVE
Trisomy 18 (Edwards syndrome): NEGATIVE
Trisomy 21 (Down syndrome): NEGATIVE
XXX (Triple X Syndrome): NOT DETECTED
XXY (Klinefelter Syndrome): NOT DETECTED
XYY (Jacobs Syndrome): NOT DETECTED

## 2021-01-18 ENCOUNTER — Ambulatory Visit (INDEPENDENT_AMBULATORY_CARE_PROVIDER_SITE_OTHER): Payer: 59 | Admitting: Advanced Practice Midwife

## 2021-01-18 ENCOUNTER — Encounter: Payer: Self-pay | Admitting: Advanced Practice Midwife

## 2021-01-18 ENCOUNTER — Other Ambulatory Visit: Payer: Self-pay

## 2021-01-18 VITALS — BP 110/60 | Wt 136.0 lb

## 2021-01-18 DIAGNOSIS — Z369 Encounter for antenatal screening, unspecified: Secondary | ICD-10-CM

## 2021-01-18 DIAGNOSIS — Z3A14 14 weeks gestation of pregnancy: Secondary | ICD-10-CM

## 2021-01-18 DIAGNOSIS — Z3402 Encounter for supervision of normal first pregnancy, second trimester: Secondary | ICD-10-CM

## 2021-01-18 LAB — POCT URINALYSIS DIPSTICK OB
Glucose, UA: NEGATIVE
POC,PROTEIN,UA: NEGATIVE

## 2021-01-18 NOTE — Progress Notes (Signed)
ROB- declined flu shot, no concerns

## 2021-01-18 NOTE — Progress Notes (Signed)
  Routine Prenatal Care Visit  Subjective  Sherry Lutz is a 25 y.o. G1P0 at [redacted]w[redacted]d being seen today for ongoing prenatal care.  She is currently monitored for the following issues for this low-risk pregnancy and has Reactive airway disease; Complex regional pain syndrome I of other specified site; RSD (reflex sympathetic dystrophy); and Supervision of normal pregnancy on their problem list.  ----------------------------------------------------------------------------------- Patient reports nausea is controlled with unisom.    . Vag. Bleeding: None.   . Leaking Fluid denies.  ----------------------------------------------------------------------------------- The following portions of the patient's history were reviewed and updated as appropriate: allergies, current medications, past family history, past medical history, past social history, past surgical history and problem list. Problem list updated.  Objective  Blood pressure 110/60, weight 136 lb (61.7 kg), last menstrual period 09/27/2020. Pregravid weight 134 lb (60.8 kg) Total Weight Gain 2 lb (0.907 kg) Urinalysis: Urine Protein    Urine Glucose    Fetal Status: Fetal Heart Rate (bpm): 155         General:  Alert, oriented and cooperative. Patient is in no acute distress.  Skin: Skin is warm and dry. No rash noted.   Cardiovascular: Normal heart rate noted  Respiratory: Normal respiratory effort, no problems with respiration noted  Abdomen: Soft, gravid, appropriate for gestational age. Pain/Pressure: Absent     Pelvic:  Cervical exam deferred        Extremities: Normal range of motion.  Edema: None  Mental Status: Normal mood and affect. Normal behavior. Normal judgment and thought content.   Assessment   25 y.o. G1P0 at [redacted]w[redacted]d by  07/15/2021, by Ultrasound presenting for routine prenatal visit  Plan   pregnancy1  Problems (from 12/10/20 to present)    Problem Noted Resolved   Supervision of normal pregnancy 12/10/2020 by  Tresea Mall, CNM No   Overview Signed 12/10/2020 10:40 AM by Tresea Mall, CNM     Nursing Staff Provider  Office Location  [facility] Dating    Language  English Anatomy US    Flu Vaccine   Genetic Screen  NIPS:   TDaP vaccine    Hgb A1C or  GTT Early : NA Third trimester :   Covid    LAB RESULTS   Rhogam   Blood Type O/Positive/-- (08/23 0942)   Feeding Plan Breast Antibody    Contraception  Rubella    Circumcision  RPR     Pediatrician   HBsAg     Support Person Husband Matt HIV    Prenatal Classes  Varicella     GBS  (For PCN allergy, check sensitivities)   BTL Consent     VBAC Consent  Pap 2021 negative     Hgb Electro    Pelvis Tested  CF      SMA                   Preterm labor symptoms and general obstetric precautions including but not limited to vaginal bleeding, contractions, leaking of fluid and fetal movement were reviewed in detail with the patient.   Return in about 4 weeks (around 02/15/2021) for anatomy scan and rob after.  Tresea Mall, CNM 01/18/2021 9:08 AM

## 2021-01-18 NOTE — Addendum Note (Signed)
Addended by: Donnetta Hail on: 01/18/2021 09:19 AM   Modules accepted: Orders

## 2021-02-13 ENCOUNTER — Ambulatory Visit
Admission: RE | Admit: 2021-02-13 | Discharge: 2021-02-13 | Disposition: A | Discharge status: Home / Self Care | Payer: 59 | Source: Ambulatory Visit | Attending: Advanced Practice Midwife | Admitting: Advanced Practice Midwife

## 2021-02-13 DIAGNOSIS — Z3402 Encounter for supervision of normal first pregnancy, second trimester: Secondary | ICD-10-CM | POA: Insufficient documentation

## 2021-02-13 DIAGNOSIS — Z369 Encounter for antenatal screening, unspecified: Secondary | ICD-10-CM | POA: Insufficient documentation

## 2021-02-18 ENCOUNTER — Ambulatory Visit (INDEPENDENT_AMBULATORY_CARE_PROVIDER_SITE_OTHER): Payer: 59 | Admitting: Obstetrics & Gynecology

## 2021-02-18 ENCOUNTER — Encounter: Payer: Self-pay | Admitting: Obstetrics & Gynecology

## 2021-02-18 ENCOUNTER — Other Ambulatory Visit: Payer: Self-pay

## 2021-02-18 VITALS — BP 120/80 | Wt 142.0 lb

## 2021-02-18 DIAGNOSIS — Z3A19 19 weeks gestation of pregnancy: Secondary | ICD-10-CM

## 2021-02-18 DIAGNOSIS — Z131 Encounter for screening for diabetes mellitus: Secondary | ICD-10-CM

## 2021-02-18 DIAGNOSIS — Z3402 Encounter for supervision of normal first pregnancy, second trimester: Secondary | ICD-10-CM

## 2021-02-18 LAB — POCT URINALYSIS DIPSTICK OB
Glucose, UA: NEGATIVE
POC,PROTEIN,UA: NEGATIVE

## 2021-02-18 NOTE — Progress Notes (Signed)
  Subjective  Fetal Movement? yes Contractions? no Leaking Fluid? no Vaginal Bleeding? no  Objective  BP 120/80   Wt 142 lb (64.4 kg)   LMP 09/27/2020   BMI 25.97 kg/m  General: NAD Pumonary: no increased work of breathing Abdomen: gravid, non-tender Extremities: no edema Psychiatric: mood appropriate, affect full  Assessment  25 y.o. G1P0 at [redacted]w[redacted]d by  07/15/2021, by Ultrasound presenting for routine prenatal visit  Plan   Problem List Items Addressed This Visit      Other   Supervision of normal pregnancy - Primary   Relevant Orders   POC Urinalysis Dipstick OB (Completed)  Other Visit Diagnoses    [redacted] weeks gestation of pregnancy       Screening for diabetes mellitus       Relevant Orders   28 Week RH+Panel    PNV, [place] discussed Breast feeding Plans POP  pregnancy1  Problems (from 12/10/20 to present)    Problem Noted Resolved   Supervision of normal pregnancy 12/10/2020 by Tresea Mall, CNM No   Overview Addendum 02/18/2021  8:27 AM by Nadara Mustard, MD     Nursing Staff Provider  Office Location  [facility] Dating  LMP, [place]  Language  English Anatomy US  [facility] nml  Flu Vaccine  Declines Genetic Screen  NIPS: nml XY  TDaP vaccine    Hgb A1C or  GTT Early : NA Third trimester :   Covid    LAB RESULTS   Rhogam   Blood Type O/Positive/-- (09/30 1113)   Feeding Plan Breast Antibody Negative (09/30 1113)  Contraception POP Rubella 4.17 (09/30 1113)  Circumcision  RPR Non Reactive (09/30 1113)   Pediatrician   HBsAg Negative (09/30 1113)   Support Person Husband Matt HIV Non Reactive (09/30 1113)  Prenatal Classes  Varicella Imm    GBS  (For PCN allergy, check sensitivities)   BTL Consent no    VBAC Consent n/a Pap 2021 negative        Pelvis Tested no               Sherry Major, MD, [facility], [facility] 02/18/2021  10:01 AM

## 2021-03-04 ENCOUNTER — Encounter: Payer: 59 | Admitting: Obstetrics & Gynecology

## 2021-03-19 ENCOUNTER — Ambulatory Visit (INDEPENDENT_AMBULATORY_CARE_PROVIDER_SITE_OTHER): Payer: 59 | Admitting: Licensed Practical Nurse

## 2021-03-19 ENCOUNTER — Other Ambulatory Visit: Payer: Self-pay

## 2021-03-19 ENCOUNTER — Encounter: Payer: 59 | Admitting: Obstetrics and Gynecology

## 2021-03-19 VITALS — BP 120/80 | Wt 147.6 lb

## 2021-03-19 DIAGNOSIS — Z3400 Encounter for supervision of normal first pregnancy, unspecified trimester: Secondary | ICD-10-CM

## 2021-03-19 DIAGNOSIS — Z3A23 23 weeks gestation of pregnancy: Secondary | ICD-10-CM

## 2021-03-19 LAB — POCT URINALYSIS DIPSTICK OB
Glucose, UA: NEGATIVE
POC,PROTEIN,UA: NEGATIVE

## 2021-03-19 NOTE — Progress Notes (Signed)
°  Routine Prenatal Care Visit  Subjective  Sherry Lutz is a 25 y.o. G1P0 at [redacted]w[redacted]d being seen today for ongoing prenatal care.  She is currently monitored for the following issues for this low-risk pregnancy and has Reactive airway disease; Complex regional pain syndrome I of other specified site; RSD (reflex sympathetic dystrophy); and Supervision of normal pregnancy on their problem list.  ----------------------------------------------------------------------------------- Patient reports: Here with Matt.  Continues to have a little nausea, using Unisom twice a day.  Starting to think about BF classes and CBE.  Contractions: Not present. Vag. Bleeding: None.  Movement: Present. Leaking Fluid denies.  ----------------------------------------------------------------------------------- The following portions of the patient's history were reviewed and updated as appropriate: allergies, current medications, past family history, past medical history, past social history, past surgical history and problem list. Problem list updated.  Objective  Blood pressure 120/80, weight 147 lb 9.6 oz (67 kg), last menstrual period 09/27/2020. Pregravid weight 134 lb (60.8 kg) Total Weight Gain 13 lb 9.6 oz (6.169 kg) Urinalysis: Urine Protein Negative  Urine Glucose Negative  Fetal Status:     Movement: Present     General:  Alert, oriented and cooperative. Patient is in no acute distress.  Skin: Skin is warm and dry. No rash noted.   Cardiovascular: Normal heart rate noted  Respiratory: Normal respiratory effort, no problems with respiration noted  Abdomen: Soft, gravid, appropriate for gestational age. Pain/Pressure: Absent     Pelvic:  Cervical exam deferred        Extremities: Normal range of motion.     Mental Status: Normal mood and affect. Normal behavior. Normal judgment and thought content.   Assessment   25 y.o. G1P0 at [redacted]w[redacted]d by  07/15/2021, by Ultrasound presenting for routine prenatal  visit  Plan   pregnancy1  Problems (from 12/10/20 to present)     Problem Noted Resolved   Supervision of normal pregnancy 12/10/2020 by Tresea Mall, CNM No   Overview Addendum 03/19/2021 11:36 AM by Ellwood Sayers, CNM     Nursing Staff Provider  Office Location  [facility] Dating  LMP, [place]  Language  English Anatomy US  [facility] nml  Flu Vaccine  Declines Genetic Screen  NIPS: nml XY  TDaP vaccine    Hgb A1C or  GTT Early : NA Third trimester :   Covid    LAB RESULTS   Rhogam  n/a Blood Type O/Positive/-- (09/30 1113)   Feeding Plan Breast Antibody Negative (09/30 1113)  Contraception POP Rubella 4.17 (09/30 1113)  Circumcision Yes? RPR Non Reactive (09/30 1113)   Pediatrician   HBsAg Negative (09/30 1113)   Support Person Husband Matt HIV Non Reactive (09/30 1113)  Prenatal Classes  Varicella Imm    GBS  (For PCN allergy, check sensitivities)   BTL Consent no    VBAC Consent n/a Pap 2021 negative        Pelvis Tested no                Preterm labor symptoms and general obstetric precautions including but not limited to vaginal bleeding, contractions, leaking of fluid and fetal movement were reviewed in detail with the patient. Please refer to After Visit Summary for other counseling recommendations.   28 wk labs next visit   Carie Caddy, CNM  [facility]  03/19/21  11:38 AM

## 2021-03-19 NOTE — Patient Instructions (Signed)
OVER THE COUNTER MEDICINES  SAFE IN PREGNANCY   COMPLAINT                                        MEDICINE                                                 Constipation Add fiber supplements such as Metamucil, Fibercon, or Citrucel.  Increasing fiber intake via bran cereals, oatmeal, leafy greens, and prunes is another alternative.  Increase you fluid intake.  You may also add Colace 100mg by mouth twice daily or Senakot   Cough/Cold      Robitusin, Mucinex  Cuts and  Scrapes Bacitracin, Neosporin, Plysporin  Dental Pain     Tylenol. Avoid aspirin  products, which  could cause bleeding problems for mother and baby.  Also avoid ibuprofen products, such as Advil, Motrin or Aleve unless your doctor or nurse specifically recommends these drugs.  Diarrhea Immodium, Kaopectate, or Donnagel PG .  Ensure adequate hydration by taking in plenty of clear liquids such as Gatorade, ginger ale, and jello.  Call if symptoms persist over 24-hrs.  Do NOT take Pepto-Bismol  Fever Take Tylenol *, Extra-Strength Tylenol *, or any aspirin-free pain reliever (acetaminophen).  Avoid aspirin products, which could cause bleeding problems for mother and baby.  Also avoid ibuprofen products, such as Advil, Motrin or Aleve unless your doctor or nurse specifically recommends these drugs.  If you temperature is over 101o F (38.3o C), call the  clinic  Gas    Gaviscon,, Mylicon,  Riopan    Headache   Tylenol. Avoid aspirin                                                                         products, which  could cause bleeding problems for mother and baby.  Also avoid ibuprofen products, such as Advil, Motrin or Aleve unless your doctor or nurse specifically recommends these drugs.  Head Lice     Nix  Heartburn/Indigestion TUMS, Rolaids, Zantac, Mylanta. Maalox   Avoid fatty, fried, or spicy foods.  Eat small frequent meals 5-6 times daily as opposed to 3 large meals  a day.  Hemorrhoids Annusol HC, Tucks , Perperation H , or Dermaplast.  Warm sitz baths for 30 minutes twice daily.  Air dry and sleep without underwear.  Avoid constipation and see recommendation above for constipation as this may exacerbate/worsen you hemorrhoids  Leg Cramps     TUMS, Vitamin with Potassium  Leg Swelling  Elevate legs above                                                                                              waist, lay on your left side.  Well fitting  shoes and support  hose  Muscle Aches                Tylenol. Avoid aspirin products, which  could cause bleeding problems for mother and baby.  Also avoid ibuprofen products, such as Advil, Motrin or Aleve unless your doctor or nurse specifically recommends these drugs.  Nausea/Vomiting Emetrol.  Supportive measures to ensure you stay adequately hydrated.  Sips of juice, Gatorade, ginger ale 4 times an hour.  If you are unable to tolerate fluid intake for greater than 8hrs call the clinic.  You may want to allow ginger ale to go flat as carbonation my precipitate emesis.  Over the counter chewable prenatal vitamins are available.  Vitamin B6 (pyridoxine) 10 to 24mg every 8hrs and doxylamine (Unisome sleep tabs) 25mg at bedtime and 12.5mg in the morning is first line.  Note that B6 supplements are not FDA regulated, there is a prescription of the above combination commercially available called Bonjesta.  Nosebleeds Apply ice pack to nose.  Particularly in winter time with dryer air, consider the use of a humidifier   Painful urination    Call clinic  Rash/ Itch Benadryl, Aveeno, Cortaid.  For severe whole body itching in the late second early third trimester call the office  Sleep Problems    Benadryl, Tylenol  PM, or Unisom  Seasonal Allergies    Claritin, Zyrtec,          Benadryl, Mucinex  Sinus Congestion Tylenol with Sudafed (pseudoephedrine no phenylephrine) or Actifed.  Sore  Throat Chloroseptic lozenges and sprays such as Cepacol.  Salt water gargles (1 teaspoon of table salt in one quart of water)  Call if associated temperature above 101 Fahrenheit or 38.3 Celsius  Yeast Infection     Monistat    

## 2021-03-24 NOTE — L&D Delivery Note (Addendum)
Obstetrical Delivery Note  ? ?Date of Delivery:   06/27/2021 ?Primary OB:   [facility] ?Gestational Age/EDD: [redacted]w[redacted]d (Dated by 6wkus) ?Reason for Admission: Induction of labor ?Antepartum complications: persistent headache ? ?Delivered By:   Siri Cole, CNM  ? ?Delivery Type:   spontaneous vaginal delivery  ?Delivery Details:   Arrived 1/50/-2, FB and cytotec placed, she received a second dose of Cytotec, Pitocin started 2118, AROM for clear at 0018, she received a labor epidural. 10cm and +2 at 0552.  She began pushing then, she made consistent progress with each push. While reclined back she had SVB of viable female at 0714, infant birthed OA to LOA, followed by easy shoulders. Infant to mother's abd, vigorous cry, HR >100.  Placenta delivered then cord double clamped and cut by dad.  On inspection of perineum a 2nd degree vaginal tear found that was actively bleeding,  area sutured with 2-o and 3-o vicryl, now hemostatic.  Infant skin to skin with mom, both stable.  ?Anesthesia:    epidural ?Intrapartum complications: None ?GBS:    Negative ?Laceration:    vaginal ?Episiotomy:    none ?Rectal exam:   deferred ?Placenta:    Delivered and expressed via active management. Intact: yes. To pathology: no.  ?Delayed Cord Clamping: yes ?Estimated Blood Loss:  ? ?Baby:    Liveborn female, APGARs 9/9, weight pending gm ? ?Carie Caddy, CNM  ?[facility], [facility]  ?@TODAY @  ?8:27 AM  ? ?

## 2021-04-12 ENCOUNTER — Other Ambulatory Visit: Payer: 59

## 2021-04-12 ENCOUNTER — Other Ambulatory Visit: Payer: Self-pay

## 2021-04-12 ENCOUNTER — Encounter: Payer: Self-pay | Admitting: Advanced Practice Midwife

## 2021-04-12 ENCOUNTER — Ambulatory Visit (INDEPENDENT_AMBULATORY_CARE_PROVIDER_SITE_OTHER): Payer: 59 | Admitting: Advanced Practice Midwife

## 2021-04-12 VITALS — BP 100/60 | Wt 150.0 lb

## 2021-04-12 DIAGNOSIS — Z3A26 26 weeks gestation of pregnancy: Secondary | ICD-10-CM

## 2021-04-12 DIAGNOSIS — Z3402 Encounter for supervision of normal first pregnancy, second trimester: Secondary | ICD-10-CM

## 2021-04-12 DIAGNOSIS — Z131 Encounter for screening for diabetes mellitus: Secondary | ICD-10-CM

## 2021-04-12 LAB — POCT URINALYSIS DIPSTICK OB
Glucose, UA: NEGATIVE
POC,PROTEIN,UA: NEGATIVE

## 2021-04-12 NOTE — Progress Notes (Signed)
Routine Prenatal Care Visit  Subjective  Sherry Lutz is a 26 y.o. G1P0 at [redacted]w[redacted]d being seen today for ongoing prenatal care.  She is currently monitored for the following issues for this low-risk pregnancy and has Reactive airway disease; Complex regional pain syndrome I of other specified site; RSD (reflex sympathetic dystrophy); and Supervision of normal pregnancy on their problem list.  ----------------------------------------------------------------------------------- Patient reports mild heartburn with relief from TUMS.   Contractions: Not present. Vag. Bleeding: None.  Movement: Present. Leaking Fluid denies.  ----------------------------------------------------------------------------------- The following portions of the patient's history were reviewed and updated as appropriate: allergies, current medications, past family history, past medical history, past social history, past surgical history and problem list. Problem list updated.  Objective  Blood pressure 100/60, weight 150 lb (68 kg), last menstrual period 09/27/2020. Pregravid weight 134 lb (60.8 kg) Total Weight Gain 16 lb (7.258 kg) Urinalysis: Urine Protein    Urine Glucose    Fetal Status: Fetal Heart Rate (bpm): 145 Fundal Height: 26 cm Movement: Present     General:  Alert, oriented and cooperative. Patient is in no acute distress.  Skin: Skin is warm and dry. No rash noted.   Cardiovascular: Normal heart rate noted  Respiratory: Normal respiratory effort, no problems with respiration noted  Abdomen: Soft, gravid, appropriate for gestational age. Pain/Pressure: Absent     Pelvic:  Cervical exam deferred        Extremities: Normal range of motion.  Edema: None  Mental Status: Normal mood and affect. Normal behavior. Normal judgment and thought content.   Assessment   26 y.o. G1P0 at [redacted]w[redacted]d by  07/15/2021, by Ultrasound presenting for routine prenatal visit  Plan   [email or web address]  Problems (from 12/10/20 to present)     Problem Noted Resolved   Supervision of normal pregnancy 12/10/2020 by Rod Can, CNM No   Overview Addendum 03/19/2021 11:36 AM by Allen Derry, CNM     Nursing Staff Provider  Office Location  [facility] Dating  LMP, [place]  Language  English Anatomy US  [facility] nml  Flu Vaccine  Declines Genetic Screen  NIPS: nml XY  TDaP vaccine    Hgb A1C or  GTT Early : NA Third trimester :   Covid    LAB RESULTS   Rhogam  n/a Blood Type O/Positive/-- (09/30 1113)   Feeding Plan Breast Antibody Negative (09/30 1113)  Contraception POP Rubella 4.17 (09/30 1113)  Circumcision Yes? RPR Non Reactive (09/30 1113)   Pediatrician   HBsAg Negative (09/30 1113)   Support Person Husband Matt HIV Non Reactive (09/30 1113)  Prenatal Classes  Varicella Imm    GBS  (For PCN allergy, check sensitivities)   BTL Consent no    VBAC Consent n/a Pap 2021 negative        Pelvis Tested no               Preterm labor symptoms and general obstetric precautions including but not limited to vaginal bleeding, contractions, leaking of fluid and fetal movement were reviewed in detail with the patient. Please refer to After Visit Summary for other counseling recommendations.   Return in about 2 weeks (around 04/26/2021) for rob.  Rod Can, CNM 04/12/2021 9:17 AM

## 2021-04-12 NOTE — Addendum Note (Signed)
Addended by: Cornelius Moras D on: 04/12/2021 09:23 AM   Modules accepted: Orders

## 2021-04-12 NOTE — Patient Instructions (Signed)

## 2021-04-13 LAB — 28 WEEK RH+PANEL
Basophils Absolute: 0 10*3/uL (ref 0.0–0.2)
Basos: 0 %
EOS (ABSOLUTE): 0.1 10*3/uL (ref 0.0–0.4)
Eos: 1 %
Gestational Diabetes Screen: 87 mg/dL (ref 70–139)
HIV Screen 4th Generation wRfx: NONREACTIVE
Hematocrit: 31.5 % — ABNORMAL LOW (ref 34.0–46.6)
Hemoglobin: 10.5 g/dL — ABNORMAL LOW (ref 11.1–15.9)
Immature Grans (Abs): 0.1 10*3/uL (ref 0.0–0.1)
Immature Granulocytes: 1 %
Lymphocytes Absolute: 1.3 10*3/uL (ref 0.7–3.1)
Lymphs: 17 %
MCH: 30.7 pg (ref 26.6–33.0)
MCHC: 33.3 g/dL (ref 31.5–35.7)
MCV: 92 fL (ref 79–97)
Monocytes Absolute: 0.5 10*3/uL (ref 0.1–0.9)
Monocytes: 7 %
Neutrophils Absolute: 5.9 10*3/uL (ref 1.4–7.0)
Neutrophils: 74 %
Platelets: 185 10*3/uL (ref 150–450)
RBC: 3.42 x10E6/uL — ABNORMAL LOW (ref 3.77–5.28)
RDW: 12.1 % (ref 11.7–15.4)
RPR Ser Ql: NONREACTIVE
WBC: 7.9 10*3/uL (ref 3.4–10.8)

## 2021-04-29 ENCOUNTER — Other Ambulatory Visit: Payer: Self-pay

## 2021-04-29 ENCOUNTER — Encounter: Payer: Self-pay | Admitting: Licensed Practical Nurse

## 2021-04-29 ENCOUNTER — Ambulatory Visit (INDEPENDENT_AMBULATORY_CARE_PROVIDER_SITE_OTHER): Payer: 59 | Admitting: Licensed Practical Nurse

## 2021-04-29 VITALS — BP 118/80 | Wt 152.0 lb

## 2021-04-29 DIAGNOSIS — Z3A29 29 weeks gestation of pregnancy: Secondary | ICD-10-CM

## 2021-04-29 DIAGNOSIS — Z3400 Encounter for supervision of normal first pregnancy, unspecified trimester: Secondary | ICD-10-CM

## 2021-04-29 LAB — POCT URINALYSIS DIPSTICK OB
Glucose, UA: NEGATIVE
POC,PROTEIN,UA: NEGATIVE

## 2021-04-29 NOTE — Progress Notes (Signed)
Routine Prenatal Care Visit  Subjective  Sherry Lutz is a 26 y.o. G1P0 at [redacted]w[redacted]d being seen today for ongoing prenatal care.  She is currently monitored for the following issues for this low-risk pregnancy and has Reactive airway disease; Complex regional pain syndrome I of other specified site; RSD (reflex sympathetic dystrophy); and Supervision of normal pregnancy on their problem list.  ----------------------------------------------------------------------------------- Patient reports heartburn and nausea-feel it is managable.  Here with her mom. Doing lots of reading on labor/birth and breastfeeding.  Contractions: Not present. Vag. Bleeding: None.  Movement: Present. Leaking Fluid denies.  ----------------------------------------------------------------------------------- The following portions of the patient's history were reviewed and updated as appropriate: allergies, current medications, past family history, past medical history, past social history, past surgical history and problem list. Problem list updated.  Objective  Blood pressure 118/80, weight 152 lb (68.9 kg), last menstrual period 09/27/2020. Pregravid weight 134 lb (60.8 kg) Total Weight Gain 18 lb (8.165 kg) Urinalysis: Urine Protein    Urine Glucose    Fetal Status: Fetal Heart Rate (bpm): 120 Fundal Height: 30 cm Movement: Present     General:  Alert, oriented and cooperative. Patient is in no acute distress.  Skin: Skin is warm and dry. No rash noted.   Cardiovascular: Normal heart rate noted  Respiratory: Normal respiratory effort, no problems with respiration noted  Abdomen: Soft, gravid, appropriate for gestational age. Pain/Pressure: Absent     Pelvic:  Cervical exam deferred        Extremities: Normal range of motion.  Edema: None  Mental Status: Normal mood and affect. Normal behavior. Normal judgment and thought content.   Assessment   26 y.o. G1P0 at [redacted]w[redacted]d by  07/15/2021, by Ultrasound presenting for  routine prenatal visit  Plan   [email or web address]  Problems (from 12/10/20 to present)     Problem Noted Resolved   Supervision of normal pregnancy 12/10/2020 by Rod Can, CNM No   Overview Addendum 03/19/2021 11:36 AM by Allen Derry, CNM     Nursing Staff Provider  Office Location  [facility] Dating  LMP, [place]  Language  English Anatomy US  [facility] nml  Flu Vaccine  Declines Genetic Screen  NIPS: nml XY  TDaP vaccine    Hgb A1C or  GTT Early : NA Third trimester :   Covid    LAB RESULTS   Rhogam  n/a Blood Type O/Positive/-- (09/30 1113)   Feeding Plan Breast Antibody Negative (09/30 1113)  Contraception POP Rubella 4.17 (09/30 1113)  Circumcision Yes? RPR Non Reactive (09/30 1113)   Pediatrician   HBsAg Negative (09/30 1113)   Support Person Husband Matt HIV Non Reactive (09/30 1113)  Prenatal Classes  Varicella Imm    GBS  (For PCN allergy, check sensitivities)   BTL Consent no    VBAC Consent n/a Pap 2021 negative        Pelvis Tested no                Preterm labor symptoms and general obstetric precautions including but not limited to vaginal bleeding, contractions, leaking of fluid and fetal movement were reviewed in detail with the patient. Please refer to After Visit Summary for other counseling recommendations.   Return in about 2 weeks (around 05/13/2021) for ROB.  Roberto Scales, CNM  Sherry Lutz, [facility]  04/29/21  9:33 AM

## 2021-05-13 ENCOUNTER — Ambulatory Visit (INDEPENDENT_AMBULATORY_CARE_PROVIDER_SITE_OTHER): Payer: 59 | Admitting: Obstetrics

## 2021-05-13 ENCOUNTER — Other Ambulatory Visit: Payer: Self-pay

## 2021-05-13 VITALS — BP 118/74 | Wt 154.0 lb

## 2021-05-13 DIAGNOSIS — Z3A31 31 weeks gestation of pregnancy: Secondary | ICD-10-CM

## 2021-05-13 DIAGNOSIS — Z3400 Encounter for supervision of normal first pregnancy, unspecified trimester: Secondary | ICD-10-CM

## 2021-05-13 NOTE — Progress Notes (Signed)
Routine Prenatal Care Visit  Subjective  Sherry Lutz is a 26 y.o. G1P0 at [redacted]w[redacted]d being seen today for ongoing prenatal care.  She is currently monitored for the following issues for this low-risk pregnancy and has Reactive airway disease; Complex regional pain syndrome I of other specified site; RSD (reflex sympathetic dystrophy); and Supervision of normal pregnancy on their problem list.  ----------------------------------------------------------------------------------- Patient reports no complaints.   Contractions: Not present. Vag. Bleeding: None.  Movement: Present. Leaking Fluid denies.  ----------------------------------------------------------------------------------- The following portions of the patient's history were reviewed and updated as appropriate: allergies, current medications, past family history, past medical history, past social history, past surgical history and problem list. Problem list updated.  Objective  Blood pressure 118/74, weight 154 lb (69.9 kg), last menstrual period 09/27/2020. Pregravid weight 134 lb (60.8 kg) Total Weight Gain 20 lb (9.072 kg) Urinalysis: Urine Protein    Urine Glucose    Fetal Status:     Movement: Present     General:  Alert, oriented and cooperative. Patient is in no acute distress.  Skin: Skin is warm and dry. No rash noted.   Cardiovascular: Normal heart rate noted  Respiratory: Normal respiratory effort, no problems with respiration noted  Abdomen: Soft, gravid, appropriate for gestational age. Pain/Pressure: Present     Pelvic:  Cervical exam deferred        Extremities: Normal range of motion.     Mental Status: Normal mood and affect. Normal behavior. Normal judgment and thought content.   Assessment   26 y.o. G1P0 at [redacted]w[redacted]d by  07/15/2021, by Ultrasound presenting for routine prenatal visit  Plan   [email or web address]  Problems (from 12/10/20 to present)    Problem Noted Resolved   Supervision of normal pregnancy 12/10/2020 by  Rod Can, CNM No   Overview Addendum 04/29/2021  9:34 AM by Allen Derry, CNM     Nursing Staff Provider  Office Location  [facility] Dating  LMP, [place]  Language  English Anatomy US  [facility] nml  Flu Vaccine  Declines Genetic Screen  NIPS: nml XY  TDaP vaccine    Hgb A1C or  GTT Early : NA Third trimester : 87  Covid    LAB RESULTS   Rhogam  n/a Blood Type O/Positive/-- (09/30 1113)   Feeding Plan Breast Antibody Negative (09/30 1113)  Contraception POP Rubella 4.17 (09/30 1113)  Circumcision Yes? RPR Non Reactive (09/30 1113)   Pediatrician  [place] [facility] HBsAg Negative (09/30 1113)   Support Person Husband Matt HIV Non Reactive (09/30 1113)  Prenatal Classes  Varicella Imm    GBS  (For PCN allergy, check sensitivities)   BTL Consent no    VBAC Consent n/a Pap 2021 negative        Pelvis Tested no               Preterm labor symptoms and general obstetric precautions including but not limited to vaginal bleeding, contractions, leaking of fluid and fetal movement were reviewed in detail with the patient. Please refer to After Visit Summary for other counseling recommendations.   Return in about 2 weeks (around 05/27/2021) for return OB.  Imagene Riches, CNM  05/13/2021 9:35 AM

## 2021-05-13 NOTE — Progress Notes (Signed)
No vb. No lof. Having a lot of braxton hicks

## 2021-05-27 ENCOUNTER — Other Ambulatory Visit: Payer: Self-pay

## 2021-05-27 ENCOUNTER — Encounter: Payer: Self-pay | Admitting: Advanced Practice Midwife

## 2021-05-27 ENCOUNTER — Ambulatory Visit (INDEPENDENT_AMBULATORY_CARE_PROVIDER_SITE_OTHER): Payer: 59 | Admitting: Advanced Practice Midwife

## 2021-05-27 VITALS — BP 122/70 | Wt 157.0 lb

## 2021-05-27 DIAGNOSIS — Z3A33 33 weeks gestation of pregnancy: Secondary | ICD-10-CM

## 2021-05-27 DIAGNOSIS — Z3403 Encounter for supervision of normal first pregnancy, third trimester: Secondary | ICD-10-CM

## 2021-05-27 NOTE — Progress Notes (Signed)
No vb. No lof. Pt having some swelling.  

## 2021-05-27 NOTE — Progress Notes (Signed)
Routine Prenatal Care Visit ? ?Subjective  ?Sherry Lutz is a 26 y.o. G1P0 at [redacted]w[redacted]d being seen today for ongoing prenatal care.  She is currently monitored for the following issues for this low-risk pregnancy and has Reactive airway disease; Complex regional pain syndrome I of other specified site; RSD (reflex sympathetic dystrophy); and Supervision of normal pregnancy on their problem list.  ?----------------------------------------------------------------------------------- ?Patient reports no complaints. She feels prepared for childbirth- "it's getting real"  ?Contractions: Not present. Vag. Bleeding: None.  Movement: Present. Leaking Fluid denies.  ?----------------------------------------------------------------------------------- ?The following portions of the patient's history were reviewed and updated as appropriate: allergies, current medications, past family history, past medical history, past social history, past surgical history and problem list. Problem list updated. ? ?Objective  ?Blood pressure 122/70, weight 157 lb (71.2 kg), last menstrual period 09/27/2020. ?Pregravid weight 134 lb (60.8 kg) Total Weight Gain 23 lb (10.4 kg) ?Urinalysis: Urine Protein    Urine Glucose   ? ?Fetal Status: Fetal Heart Rate (bpm): 143 Fundal Height: 33 cm Movement: Present    ? ?General:  Alert, oriented and cooperative. Patient is in no acute distress.  ?Skin: Skin is warm and dry. No rash noted.   ?Cardiovascular: Normal heart rate noted  ?Respiratory: Normal respiratory effort, no problems with respiration noted  ?Abdomen: Soft, gravid, appropriate for gestational age. Pain/Pressure: Absent     ?Pelvic:  Cervical exam deferred        ?Extremities: Normal range of motion.  Edema: Trace  ?Mental Status: Normal mood and affect. Normal behavior. Normal judgment and thought content.  ? ?Assessment  ? ?26 y.o. G1P0 at [redacted]w[redacted]d by  07/15/2021, by Ultrasound presenting for routine prenatal visit ? ?Plan  ? ?pregnancy1   Problems (from 12/10/20 to present)   ? Problem Noted Resolved  ? Supervision of normal pregnancy 12/10/2020 by Tresea Mall, CNM No  ? Overview Addendum 04/29/2021  9:34 AM by Ellwood Sayers, CNM  ?   ?Nursing Staff Provider  ?Office Location  [facility] Dating  LMP, [place]  ?Language  Ambulance person [place]  [facility] nml  ?Flu Vaccine  Declines Genetic Screen  NIPS: nml XY  ?TDaP vaccine    Hgb A1C or  ?GTT Early : NA ?Third trimester : 45  ?Covid    LAB RESULTS   ?Rhogam  n/a Blood Type O/Positive/-- (09/30 1113)   ?Feeding Plan Breast Antibody Negative (09/30 1113)  ?Contraception POP Rubella 4.17 (09/30 1113)  ?Circumcision Yes? RPR Non Reactive (09/30 1113)   ?Pediatrician  [place] [facility] HBsAg Negative (09/30 1113)   ?Support Person Husband Matt HIV Non Reactive (09/30 1113)  ?Prenatal Classes  Varicella Imm  ?  GBS  (For PCN allergy, check sensitivities)   ?BTL Consent no    ?VBAC Consent n/a Pap 2021 negative   ?     ?Pelvis Tested no    ? ? ?  ?  ?  ?  ? ?Preterm labor symptoms and general obstetric precautions including but not limited to vaginal bleeding, contractions, leaking of fluid and fetal movement were reviewed in detail with the patient. ?Please refer to After Visit Summary for other counseling recommendations.  ? ?Return in about 2 weeks (around 06/10/2021) for rob. ? ?Tresea Mall, CNM ?05/27/2021 10:10 AM   ? ?

## 2021-05-27 NOTE — Patient Instructions (Signed)
Pain Relief During Labor and Delivery Many things can cause pain during labor and delivery, including: Pressure due to the baby moving through the pelvis. Stretching of tissues due to the baby moving through the birth canal. Muscle tension due to anxiety or nervousness. The uterus tightening (contracting)and relaxing to help move the baby. How do I get pain relief during labor and delivery? Discuss your pain relief options with your health care provider during your prenatal visits. Explore the options offered by your hospital or birth center. There are many ways to deal with the pain of labor and delivery. You can try relaxation techniques or doing relaxing activities, taking a warm shower or bath (hydrotherapy), or other methods. There are also many medicines available to help control pain. Relaxation techniques and activities Practice relaxation techniques or do relaxing activities, such as: Focused breathing. Meditation. Visualization. Aroma therapy. Listening to your favorite music. Hypnosis. Hydrotherapy Take a warm shower or bath. This may: Provide comfort and relaxation. Lessen your feeling of pain. Reduce the amount of pain medicine needed. Shorten the length of labor. Other methods Try doing other things, such as: Getting a massage or having counterpressure on your back. Applying warm packs or ice packs. Changing positions often, moving around, or using a birthing ball. Medicines You may be given: Pain medicine through an IV or an injection into a muscle. Pain medicine inserted into your spinal column. Injections of sterile water just under the skin on your lower back. Nitrous oxide inhalation therapy, also called laughing gas. What kinds of medicine are available for pain relief? There are two kinds of medicines that can be used to relieve pain during labor and delivery: Analgesics. These medicines decrease pain without causing you to lose feeling or the ability to move  your muscles. Anesthetics. These medicines block feeling in the body and can decrease your ability to move freely. Both kinds of medicine can cause minor side effects, such as nausea, trouble concentrating, and sleepiness. They can also affect the baby's heart rate before birth and his or her breathing after birth. For this reason, health care providers are careful about when and how much medicine is given. Which medicines are used to provide pain relief? Common medicines The most common medicines used to help manage pain during labor and delivery include: Opioids. Opioids are medicines that decrease how much pain you feel (perception of pain). These medicines can be given through an IV or may be used with anesthetics to block pain. Epidural analgesia. Epidural analgesia is given through a very thin tube that is inserted into the lower back. Medicine is delivered continuously to the area near your spinal column nerves (epidural space). After having this treatment, you may be able to move your legs, but you will not be able to walk. Depending on the amount and type of medicine given, you may lose all feeling in the lower half of your body, or you may have some sensation, including the urge to push. This treatment can be used to give pain relief for a vaginal birth. Sometimes, a numbing medicine is injected into the spinal fluid when an epidural catheter is placed. This provides for immediate relief but only lasts for 1-2 hours. Once it wears off, the epidural will provide pain relief. This is called a combined spinal-epidural (CSE) block. Intrathecal analgesia (spinal analgesia). Intrathecal analgesia is similar to epidural analgesia, but the medicine is injected into the spinal fluid instead of the epidural space. It is usually only given once. It  starts to relieve pain quickly, but the pain relief lasts only 1-2 hours. °Pudendal block. This block is done by injecting numbing medicine through the wall of  the vagina and into a nerve in the pelvis. °Other medicines °Other medicines used to help manage pain during labor and delivery include: °Local anesthetics. These are used to numb a small area of the body. They may be used along with another kind of medicine or used to numb the nerves of the vagina, cervix, and perineum during the second stage of labor. °Spinal block (spinal anesthesia). Spinal anesthesia is similar to spinal analgesia, but the medicine that is used contains longer-acting numbing medicines and pain medicines. This type of anesthesia can be used for a cesarean delivery and allows you to stay awake for the birth of your baby. °General anesthetics cause you to lose consciousness so you do not feel pain. They are usually only used for an emergency cesarean delivery. These medicines are given through an IV or a mask or both. °These medicines are used as part of a procedure or for an emergency delivery. °Summary °Women have many options to help them manage the pain associated with labor and delivery. °You can try doing relaxing activities, taking a warm shower or bath, or other methods. °There are also many medicines available to help control pain during labor and delivery. °Talk with your health care provider about what options are available to you. °This information is not intended to replace advice given to you by your health care provider. Make sure you discuss any questions you have with your health care provider. °Document Revised: 01/26/2019 Document Reviewed: 01/26/2019 °Elsevier Patient Education © 2022 [employer] °Signs and Symptoms of Labor °Labor is the body's natural process of moving the baby and the placenta out of the uterus. The process of labor usually starts when the baby is full-term, between 39 and 41 weeks of pregnancy. °Signs and symptoms that you are close to going into labor °As your body prepares for labor and the birth of your baby, you may notice the following symptoms in  the weeks and days before true labor starts: °Passing a small amount of thick, bloody mucus from your vagina. This is called normal bloody show or losing your mucus plug. This may happen more than a week before labor begins, or right before labor begins, as the opening of the cervix starts to widen (dilate). For some women, the entire mucus plug passes at once. For others, pieces of the mucus plug may gradually pass over several days. °Your baby moving (dropping) lower in your pelvis to get into position for birth (lightening). When this happens, you may feel more pressure on your bladder and pelvic bone and less pressure on your ribs. This may make it easier to breathe. It may also cause you to need to urinate more often and have problems with bowel movements. °Having "practice contractions," also called Braxton Hicks contractions or false labor. These occur at irregular (unevenly spaced) intervals that are more than 10 minutes apart. False labor contractions are common after exercise or sexual activity. They will stop if you change position, rest, or drink fluids. These contractions are usually mild and do not get stronger over time. They may feel like: °A backache or back pain. °Mild cramps, similar to menstrual cramps. °Tightening or pressure in your abdomen. °Other early symptoms include: °Nausea or loss of appetite. °Diarrhea. °Having a sudden burst of energy, or feeling very tired. °Mood changes. °Having trouble sleeping. °  Signs and symptoms that labor has begun °Signs that you are in labor may include: °Having contractions that come at regular (evenly spaced) intervals and increase in intensity. This may feel like more intense tightening or pressure in your abdomen that moves to your back. °Contractions may also feel like rhythmic pain in your upper thighs or back that comes and goes at regular intervals. °If you are delivering for the first time, this change in intensity of contractions often occurs at a  more gradual pace. °If you have given birth before, you may notice a more rapid progression of contraction changes. °Feeling pressure in the vaginal area. °Your water breaking (rupture of membranes). This is when the sac of fluid that surrounds your baby breaks. Fluid leaking from your vagina may be clear or blood-tinged. Labor usually starts within 24 hours of your water breaking, but it may take longer to begin. °Some people may feel a sudden gush of fluid; others may notice repeatedly damp underwear. °Follow these instructions at home: ° °When labor starts, or if your water breaks, call your health care provider or nurse care line. Based on your situation, they will determine when you should go in for an exam. °During early labor, you may be able to rest and manage symptoms at home. Some strategies to try at home include: °Breathing and relaxation techniques. °Taking a warm bath or shower. °Listening to music. °Using a heating pad on the lower back for pain. If directed, apply heat to the area as often as told by your health care provider. Use the heat source that your health care provider recommends, such as a moist heat pack or a heating pad. °Place a towel between your skin and the heat source. °Leave the heat on for 20-30 minutes. °Remove the heat if your skin turns bright red. This is especially important if you are unable to feel pain, heat, or cold. You have a greater risk of getting burned. °Contact a health care provider if: °Your labor has started. °Your water breaks. °You have nausea, vomiting, or diarrhea. °Get help right away if: °You have painful, regular contractions that are 5 minutes apart or less. °Labor starts before you are [redacted] weeks along in your pregnancy. °You have a fever. °You have bright red blood coming from your vagina. °You do not feel your baby moving. °You have a severe headache with or without vision problems. °You have chest pain or shortness of breath. °These symptoms may  represent a serious problem that is an emergency. Do not wait to see if the symptoms will go away. Get medical help right away. Call your local emergency services (911 in the [place] Do not drive yourself to the hospital. °Summary °Labor is your body's natural process of moving your baby and the placenta out of your uterus. °The process of labor usually starts when your baby is full-term, between 39 and 40 weeks of pregnancy. °When labor starts, or if your water breaks, call your health care provider or nurse care line. Based on your situation, they will determine when you should go in for an exam. °This information is not intended to replace advice given to you by your health care provider. Make sure you discuss any questions you have with your health care provider. °Document Revised: 07/24/2020 Document Reviewed: 07/24/2020 °Elsevier Patient Education © 2022 [employer] Inc. ° °

## 2021-06-10 ENCOUNTER — Other Ambulatory Visit: Payer: Self-pay

## 2021-06-10 ENCOUNTER — Ambulatory Visit (INDEPENDENT_AMBULATORY_CARE_PROVIDER_SITE_OTHER): Payer: 59 | Admitting: Licensed Practical Nurse

## 2021-06-10 ENCOUNTER — Encounter: Payer: Self-pay | Admitting: Licensed Practical Nurse

## 2021-06-10 VITALS — BP 122/60 | Wt 159.0 lb

## 2021-06-10 DIAGNOSIS — Z3403 Encounter for supervision of normal first pregnancy, third trimester: Secondary | ICD-10-CM

## 2021-06-10 DIAGNOSIS — Z3A35 35 weeks gestation of pregnancy: Secondary | ICD-10-CM

## 2021-06-10 DIAGNOSIS — R519 Headache, unspecified: Secondary | ICD-10-CM

## 2021-06-10 DIAGNOSIS — O26893 Other specified pregnancy related conditions, third trimester: Secondary | ICD-10-CM

## 2021-06-10 LAB — POCT URINALYSIS DIPSTICK OB
Glucose, UA: NEGATIVE
POC,PROTEIN,UA: NEGATIVE

## 2021-06-10 NOTE — Patient Instructions (Signed)
34 Week Obstetrics Instructions ? ?PRE-REGISTRATION ? ?Please plan to pre-register at the hospital during the next week.  You can complete the form only by visiting [email or web address] ? ? If you have any questions concerning this please contact the main number at [facility] [phone] ? ?SIGNS OF LABOR ? ?There is never an absolute way for an individual to determine if they ar truly in labor unless they are seen in the office or at the hospital.  However, please follow these guidelines in order to determine if you may potentially be in labor: ? A.  Contractions 5 minutes apart for an hour (first baby) ? B. Contractions 5 minutes apart for 20-30 minutes (if you have had a baby) ? C. If you have any doubts ?  -If A, B, or C occur, please go to the hospital to be seen in Labor & Delivery ?[facility] [phone] nurse will check you and contact us. Please report to the hospital through the Emergency Room entrance and they will transport you to labor and delivery. ? ?SPONTANEOUS RUPTURE OF MEMBRANES ("Water Breaks") ? ?This is usually evidenced by a gush of fluid from the vagina.  If this occurs during office hours (and you are uncertain if your water has just broken), schedule ana appointment to come into the office to be checked.  After office hours and on weekends, go to the labor and delivery unit where the nurse will check you and contact us. ? ?VAGINAL BLEEDING ? A.  "Bloody Show" - this is a thick blood streaked vaginal discharge.  It is normal and should cause no alarm. ? B. Heavy vaginal bleeding (similar to that of a period or heavier).  If this occurs, you should go IMMEDIATELY to the labor and delivery unit. ? ?WHEN TO CALL ? 1. Routine Questions: please make a list of questions you may have and merely ask them at your next visit in the office. ? 2. Urgent or Worrisome Questions:  During office hours, please  call us at [phone] to leave a message with the nurse and/or provider for assistance.  After hours, the answering system will direct you as to which provider is on call and if your concern requires that they be paged.  If so, follow the instructions on the voicemail and the provider will contact you back at their earliest convenience. ? 3. Labor or Rupture of Membranes:  This will require an examination.  Please contact the office to schedule an appointment for exam.  After office hours and on the weekends, go to the Labor and Delivery unit at the hospital where the nurse will check you and call us.  You do not need to notify us at night that you are coming. ? ?INSURANCE REGULATIONS/HOSPITAL DISCHARGE ?It is very important that you understand that not all delivers are the same.  Typically, a vaginal delivery requires a 2 day stay at the hospital and a cesarean delivery requires 4 days in the hospital for recovery.  It is your responsibility to know what time frame your insurance suggests you stay in the hospital for coverage purposes.  However, one must keep in mind that providers use their own judgment and discretion when determining when an individual is able to be discharged. ?  ? ?Headache cocktail: ?1,000mg  Tyelonol, Benadryl or Sudafed, 1 can of coke/cup of coffee ? ?Headache prevention: ?Magnesium oxide ?Riboflavin  ?Co enzyme Q 10  ?

## 2021-06-10 NOTE — Progress Notes (Signed)
Routine Prenatal Care Visit ? ?Subjective  ?Sherry Lutz is a 26 y.o. G1P0 at [redacted]w[redacted]d being seen today for ongoing prenatal care.  She is currently monitored for the following issues for this low-risk pregnancy and has Reactive airway disease; Complex regional pain syndrome I of other specified site; RSD (reflex sympathetic dystrophy); and Supervision of normal pregnancy on their problem list.  ?----------------------------------------------------------------------------------- ? ?Here with mother ?Patient reports headache and swelling in hands and feet .  She has had HA x 3 days, it is in the front, feels like pressure, yesterday she was sensitive to light and noise, has been takling 1,000mg  tylenol every 4 hours, it helps a little, denies blurry vision, she has been checking her BP's at [facility] and they have been "normal" Denies any recent nasal congestion or cold/flu symptoms.  ?Contractions: Irritability. Vag. Bleeding: None.  Movement: Present. Leaking Fluid denies.  ?----------------------------------------------------------------------------------- ?The following portions of the patient's history were reviewed and updated as appropriate: allergies, current medications, past family history, past medical history, past social history, past surgical history and problem list. Problem list updated. ? ?Objective  ?Blood pressure 122/60, weight 159 lb (72.1 kg), last menstrual period 09/27/2020. ?Pregravid weight 134 lb (60.8 kg) Total Weight Gain 25 lb (11.3 kg) ?Urinalysis: Urine Protein Negative  Urine Glucose Negative ? ?Fetal Status: Fetal Heart Rate (bpm): 135 Fundal Height: 35 cm Movement: Present    ? ?General:  Alert, oriented and cooperative. Patient is in no acute distress.  ?Skin: Skin is warm and dry. No rash noted.   ?Cardiovascular: Normal heart rate noted  ?Respiratory: Normal respiratory effort, no problems with respiration noted  ?Abdomen: Soft, gravid, appropriate for gestational age.  Pain/Pressure: Present     ?Pelvic:  Cervical exam deferred        ?Extremities: Normal range of motion.  Edema: None  ?Mental Status: Normal mood and affect. Normal behavior. Normal judgment and thought content.  ? ?Assessment  ? ?26 y.o. G1P0 at [redacted]w[redacted]d by  07/15/2021, by Ultrasound presenting for routine prenatal visit ? ?Plan  ? ?pregnancy1  Problems (from 12/10/20 to present)   ? ? Problem Noted Resolved  ? Supervision of normal pregnancy 12/10/2020 by Tresea Mall, CNM No  ? Overview Addendum 04/29/2021  9:34 AM by Ellwood Sayers, CNM  ?   ?Nursing Staff Provider  ?Office Location  [facility] Dating  LMP, [place]  ?Language  Ambulance person [place]  [facility] nml  ?Flu Vaccine  Declines Genetic Screen  NIPS: nml XY  ?TDaP vaccine    Hgb A1C or  ?GTT Early : NA ?Third trimester : 57  ?Covid    LAB RESULTS   ?Rhogam  n/a Blood Type O/Positive/-- (09/30 1113)   ?Feeding Plan Breast Antibody Negative (09/30 1113)  ?Contraception POP Rubella 4.17 (09/30 1113)  ?Circumcision Yes? RPR Non Reactive (09/30 1113)   ?Pediatrician  [place] [facility] HBsAg Negative (09/30 1113)   ?Support Person Husband Matt HIV Non Reactive (09/30 1113)  ?Prenatal Classes  Varicella Imm  ?  GBS  (For PCN allergy, check sensitivities)   ?BTL Consent no    ?VBAC Consent n/a Pap 2021 negative   ?     ?Pelvis Tested no    ? ? ?  ?  ? ?  ?  ? ?Preterm labor symptoms and general obstetric precautions including but not limited to vaginal bleeding, contractions, leaking of fluid and fetal movement were reviewed in detail with the patient. ?Please refer to After Visit Summary for other  counseling recommendations.  ? ?Return in about 1 week (around 06/17/2021) for ROB, 36 wk labs . ? ?Remedies for HA given, instructed to call back or be seen if HA does not go away within 1-2 days.  ? ?CMP,CBC and UPC collected  ? ?Carie Caddy, CNM  ?Domingo Pulse, [facility]  ?06/10/21  ?11:06 AM  ? ? ?

## 2021-06-11 LAB — CBC WITH DIFFERENTIAL/PLATELET
Basophils Absolute: 0 10*3/uL (ref 0.0–0.2)
Basos: 0 %
EOS (ABSOLUTE): 0.1 10*3/uL (ref 0.0–0.4)
Eos: 1 %
Hematocrit: 34.7 % (ref 34.0–46.6)
Hemoglobin: 11.5 g/dL (ref 11.1–15.9)
Immature Grans (Abs): 0.1 10*3/uL (ref 0.0–0.1)
Immature Granulocytes: 1 %
Lymphocytes Absolute: 1.4 10*3/uL (ref 0.7–3.1)
Lymphs: 17 %
MCH: 31 pg (ref 26.6–33.0)
MCHC: 33.1 g/dL (ref 31.5–35.7)
MCV: 94 fL (ref 79–97)
Monocytes Absolute: 0.5 10*3/uL (ref 0.1–0.9)
Monocytes: 7 %
Neutrophils Absolute: 6.3 10*3/uL (ref 1.4–7.0)
Neutrophils: 74 %
Platelets: 183 10*3/uL (ref 150–450)
RBC: 3.71 x10E6/uL — ABNORMAL LOW (ref 3.77–5.28)
RDW: 12.2 % (ref 11.7–15.4)
WBC: 8.3 10*3/uL (ref 3.4–10.8)

## 2021-06-11 LAB — PROTEIN / CREATININE RATIO, URINE
Creatinine, Urine: 38.4 mg/dL
Protein, Ur: 13.9 mg/dL
Protein/Creat Ratio: 362 mg/g creat — ABNORMAL HIGH (ref 0–200)

## 2021-06-11 LAB — COMPREHENSIVE METABOLIC PANEL
ALT: 11 IU/L (ref 0–32)
AST: 20 IU/L (ref 0–40)
Albumin/Globulin Ratio: 1.5 (ref 1.2–2.2)
Albumin: 3.6 g/dL — ABNORMAL LOW (ref 3.9–5.0)
Alkaline Phosphatase: 156 IU/L — ABNORMAL HIGH (ref 44–121)
BUN/Creatinine Ratio: 8 — ABNORMAL LOW (ref 9–23)
BUN: 4 mg/dL — ABNORMAL LOW (ref 6–20)
Bilirubin Total: <0.2 mg/dL (ref 0.0–1.2)
CO2: 17 mmol/L — ABNORMAL LOW (ref 20–29)
Calcium: 8.6 mg/dL — ABNORMAL LOW (ref 8.7–10.2)
Chloride: 106 mmol/L (ref 96–106)
Creatinine, Ser: 0.51 mg/dL — ABNORMAL LOW (ref 0.57–1.00)
Globulin, Total: 2.4 g/dL (ref 1.5–4.5)
Glucose: 99 mg/dL (ref 70–99)
Potassium: 3.8 mmol/L (ref 3.5–5.2)
Sodium: 141 mmol/L (ref 134–144)
Total Protein: 6 g/dL (ref 6.0–8.5)
eGFR: 133 mL/min/{1.73_m2} (ref >59)

## 2021-06-12 ENCOUNTER — Encounter: Payer: Self-pay | Admitting: Licensed Practical Nurse

## 2021-06-12 ENCOUNTER — Observation Stay
Admission: EM | Admit: 2021-06-12 | Discharge: 2021-06-12 | Disposition: A | Discharge status: Home / Self Care | Payer: 59 | Attending: Licensed Practical Nurse | Admitting: Licensed Practical Nurse

## 2021-06-12 ENCOUNTER — Other Ambulatory Visit: Payer: Self-pay

## 2021-06-12 ENCOUNTER — Encounter: Payer: Self-pay | Admitting: Obstetrics & Gynecology

## 2021-06-12 DIAGNOSIS — J45909 Unspecified asthma, uncomplicated: Secondary | ICD-10-CM | POA: Insufficient documentation

## 2021-06-12 DIAGNOSIS — O99893 Other specified diseases and conditions complicating puerperium: Principal | ICD-10-CM | POA: Insufficient documentation

## 2021-06-12 DIAGNOSIS — Z3A35 35 weeks gestation of pregnancy: Secondary | ICD-10-CM | POA: Diagnosis not present

## 2021-06-12 DIAGNOSIS — O99513 Diseases of the respiratory system complicating pregnancy, third trimester: Secondary | ICD-10-CM | POA: Diagnosis not present

## 2021-06-12 DIAGNOSIS — O26893 Other specified pregnancy related conditions, third trimester: Secondary | ICD-10-CM

## 2021-06-12 DIAGNOSIS — R519 Headache, unspecified: Secondary | ICD-10-CM | POA: Diagnosis not present

## 2021-06-12 DIAGNOSIS — Z3403 Encounter for supervision of normal first pregnancy, third trimester: Secondary | ICD-10-CM

## 2021-06-12 DIAGNOSIS — O26899 Other specified pregnancy related conditions, unspecified trimester: Secondary | ICD-10-CM | POA: Diagnosis present

## 2021-06-12 LAB — COMPREHENSIVE METABOLIC PANEL
ALT: 14 U/L (ref 0–44)
AST: 21 U/L (ref 15–41)
Albumin: 3.2 g/dL — ABNORMAL LOW (ref 3.5–5.0)
Alkaline Phosphatase: 139 U/L — ABNORMAL HIGH (ref 38–126)
Anion gap: 5 (ref 5–15)
BUN: 5 mg/dL — ABNORMAL LOW (ref 6–20)
CO2: 24 mmol/L (ref 22–32)
Calcium: 9.3 mg/dL (ref 8.9–10.3)
Chloride: 106 mmol/L (ref 98–111)
Creatinine, Ser: 0.55 mg/dL (ref 0.44–1.00)
GFR, Estimated: >60 mL/min (ref >60)
Glucose, Bld: 89 mg/dL (ref 70–99)
Potassium: 4.1 mmol/L (ref 3.5–5.1)
Sodium: 135 mmol/L (ref 135–145)
Total Bilirubin: 0.6 mg/dL (ref 0.3–1.2)
Total Protein: 6.5 g/dL (ref 6.5–8.1)

## 2021-06-12 LAB — PROTEIN / CREATININE RATIO, URINE
Creatinine, Urine: 25 mg/dL
Total Protein, Urine: <6 mg/dL

## 2021-06-12 MED ORDER — PROCHLORPERAZINE EDISYLATE 10 MG/2ML IJ SOLN
10.0000 mg | Freq: Four times a day (QID) | INTRAMUSCULAR | Status: DC | PRN
  Administered 2021-06-12: 10 mg via INTRAVENOUS
  Filled 2021-06-12: qty 2

## 2021-06-12 MED ORDER — BUTALBITAL-APAP-CAFFEINE 50-325-40 MG PO TABS
2.0000 | ORAL_TABLET | Freq: Once | ORAL | Status: AC
  Administered 2021-06-12: 2 via ORAL
  Filled 2021-06-12: qty 2

## 2021-06-12 MED ORDER — MAGNESIUM SULFATE 2 GM/50ML IV SOLN
2.0000 g | Freq: Once | INTRAVENOUS | Status: AC
  Administered 2021-06-12: 2 g via INTRAVENOUS
  Filled 2021-06-12: qty 50

## 2021-06-12 MED ORDER — MAGNESIUM OXIDE -MG SUPPLEMENT 400 (240 MG) MG PO TABS
400.0000 mg | ORAL_TABLET | Freq: Once | ORAL | Status: AC
  Administered 2021-06-12: 400 mg via ORAL
  Filled 2021-06-12: qty 1

## 2021-06-12 MED ORDER — SODIUM CHLORIDE 0.9 % IV SOLN: Freq: Once | INTRAVENOUS | Status: AC

## 2021-06-12 MED ORDER — DEXAMETHASONE SODIUM PHOSPHATE 10 MG/ML IJ SOLN
10.0000 mg | Freq: Once | INTRAMUSCULAR | Status: AC
  Administered 2021-06-12: 10 mg via INTRAVENOUS
  Filled 2021-06-12: qty 1

## 2021-06-12 NOTE — Progress Notes (Signed)
Pt discharged home per Carie Caddy, CNM order. Pt had relief from her headache. Pt stable and ambulatory and an After Visit Summary was printed and given to the patient. Discharge education completed with patient/family including follow up instructions, appointments, and medication list. Pt received labor and bleeding precautions. Patient able to verbalize understanding, all questions fully answered upon discharge. Patient instructed to return to ED, call 911, or call MD for any changes in condition. Pt discharged home via personal vehicle with support person.  ? ? ?

## 2021-06-12 NOTE — Progress Notes (Signed)
Pt given 2 tablets of Fioricet at 1207 for her "throbbing headache". No relief from medicine. Pt Received Magnesium oxide 400mg  PO at 1335 and prochlorperazine 10mg  injection at 1344. 1420 No relief to headache. 1435 pt reports slight relief from medicine. Dr. at bedside for neuro consult.  ?

## 2021-06-12 NOTE — Progress Notes (Signed)
? ?Obstetric H&P  ? ?Chief Complaint: Headache ? ?[facility]: [facility] ? ?History of Present Illness: 26 y.o. G1P0 3837w2d by 07/15/2021, by 10wk Ultrasound presenting to L&D for a Headache since Saturday. The headache started on Saturday, at first it was located in the front but now "it is all over", it is constant, it feels like pressure and is throbbing. She rates it a "6-7/10" She has been taking Tylenol ATC with little relief.  She was seen in the clinic on 3/20, she was instructed to take Tylenol, Sudafed and a can of coke at once. She has repeated this cocktail and has not gotten relief.   She was able to sleep last night but had the normal pregnancy sleep disruptions. Denies any nasal congestion or pain in her sinuses. She has had sensitivity to light and noise.  She has also felt dizzy over the last 3 days. Labs were collected on 3/20, her UPC was >300, when the pt was notified about her labs, she reported that she still had a HA and it has gotten worse, she was instructed to come in for an evaluation on L and D.  ? ?Pregravid weight 60.8 kg Total Weight Gain 11.3 kg ? ?pregnancy1  Problems (from 12/10/20 to present)   ? ? Problem Noted Resolved  ? Headache 06/12/2021 by Ellwood Sayersominic, Rosmary Dionisio Marie, CNM No  ? Supervision of normal pregnancy 12/10/2020 by Tresea MallGledhill, Jane, CNM No  ? Overview Addendum 04/29/2021  9:34 AM by Ellwood Sayersominic, Derrika Ruffalo Marie, CNM  ?   ?Nursing Staff Provider  ?Office Location  [facility] Dating  LMP, US  ?Language  Ambulance personnglish Anatomy US  [facility] nml  ?Flu Vaccine  Declines Genetic Screen  NIPS: nml XY  ?TDaP vaccine    Hgb A1C or  ?GTT Early : NA ?Third trimester : 3287  ?Covid    LAB RESULTS   ?Rhogam  n/a Blood Type O/Positive/-- (09/30 1113)   ?Feeding Plan Breast Antibody Negative (09/30 1113)  ?Contraception POP Rubella 4.17 (09/30 1113)  ?Circumcision Yes? RPR Non Reactive (09/30 1113)   ?Pediatrician  [place] [facility] HBsAg Negative (09/30 1113)   ?Support Person Husband Matt HIV Non Reactive (09/30  1113)  ?Prenatal Classes  Varicella Imm  ?  GBS  (For PCN allergy, check sensitivities)   ?BTL Consent no    ?VBAC Consent n/a Pap 2021 negative   ?     ?Pelvis Tested no    ? ? ?  ?  ? ?  ? ?  ?Review of Systems: 10 point review of systems negative unless otherwise noted in HPI ? ?Past Medical History: ?Patient Active Problem List  ? Diagnosis Date Noted  ? Headache 06/12/2021  ? Headache in pregnancy 06/12/2021  ? Complex regional pain syndrome I of other specified site 12/10/2020  ? Supervision of normal pregnancy 12/10/2020  ?   ?Nursing Staff Provider  ?Office Location  [facility] Dating  LMP, US  ?Language  Ambulance personnglish Anatomy US  [facility] nml  ?Flu Vaccine  Declines Genetic Screen  NIPS: nml XY  ?TDaP vaccine    Hgb A1C or  ?GTT Early : NA ?Third trimester : 5387  ?Covid    LAB RESULTS   ?Rhogam  n/a Blood Type O/Positive/-- (09/30 1113)   ?Feeding Plan Breast Antibody Negative (09/30 1113)  ?Contraception POP Rubella 4.17 (09/30 1113)  ?Circumcision Yes? RPR Non Reactive (09/30 1113)   ?Pediatrician  [facility] HBsAg Negative (09/30 1113)   ?Support Person Husband Matt HIV  Non Reactive (09/30 1113)  ?Prenatal Classes  Varicella Imm  ?  GBS  (For PCN allergy, check sensitivities)   ?BTL Consent no    ?VBAC Consent n/a Pap 2021 negative   ?     ?Pelvis Tested no    ? ? ?  ? Reactive airway disease 01/28/2019  ? RSD (reflex sympathetic dystrophy) 06/04/2011  ? ? ?Past Surgical History: ?Past Surgical History:  ?Procedure Laterality Date  ? INTERCOSTAL NERVE BLOCK    ? ? ?Past Obstetric History: ?# 1 - Date: None, Sex: None, Weight: None, GA: None, Delivery: None, Apgar1: None, Apgar5: None, Living: None, Birth Comments: None ? ? ?Past Gynecologic History: ? ?Family History: ?Family History  ?Problem Relation Age of Onset  ? Healthy Mother   ? Healthy Father   ? ? ?Social History: ?Social History  ? ?Socioeconomic History  ? Marital status: Married  ?  Spouse name: Not on file  ? Number of children: Not on file  ? Years of  education: Not on file  ? Highest education level: Not on file  ?Occupational History  ? Not on file  ?Tobacco Use  ? Smoking status: Never  ? Smokeless tobacco: Never  ?Vaping Use  ? Vaping Use: Never used  ?Substance and Sexual Activity  ? Alcohol use: No  ? Drug use: No  ? Sexual activity: Yes  ?  Birth control/protection: Pill  ?Other Topics Concern  ? Not on file  ?Social History Narrative  ? Lives with husband  ? ?Social Determinants of Health  ? ?Financial Resource Strain: Not on file  ?Food Insecurity: Not on file  ?Transportation Needs: Not on file  ?Physical Activity: Not on file  ?Stress: Not on file  ?Social Connections: Not on file  ?Intimate Partner Violence: Not on file  ? ? ?Medications: ?Prior to Admission medications   ?Medication Sig Start Date End Date Taking? Authorizing Provider  ?acetaminophen (TYLENOL) 650 MG CR tablet Take 1,000 mg by mouth every 8 (eight) hours as needed for pain.   Yes [provider]  ?doxylamine, Sleep, (UNISOM) 25 MG tablet Take 12.5 mg by mouth at bedtime as needed (for nausea).   Yes [provider]  ?ferrous sulfate 325 (65 FE) MG EC tablet Take 325 mg by mouth 3 (three) times daily with meals.   Yes [provider]  ?montelukast (SINGULAIR) 10 MG tablet Take 10 mg by mouth daily. 11/28/18  Yes [provider]  ?Prenatal w/o A Vit-Fe Fum-FA (PRENATAL VITAMIN W/FE, FA) 29-1 MG CHEW    Yes [provider]  ?Cholecalciferol 50 MCG (2000 UT) CAPS Take by mouth.    [provider]  ?fluticasone (FLOVENT HFA) 110 MCG/ACT inhaler Inhale into the lungs. 02/25/19 02/25/20  [provider]  ? ? ?Allergies: ?Allergies  ?Allergen Reactions  ? Pregabalin   ?  Other reaction(s): Hallucinations  ? Red Dye Rash  ? ? ?Physical Exam: ?Vitals: Blood pressure 121/68, pulse 95, temperature 98.2 ?F (36.8 ?C), temperature source Oral, resp. rate 18, last menstrual period 09/27/2020. ? ?UPC too low to calculate  ? ?FHT: baseline  125, moderate variability, pos accel, neg decel  ?Toco: irregular contractions, mild  ? ?General: NAD but tired appearing  ?HEENT: normocephalic, anicteric ?Pulmonary: No increased work of breathing ?Cardiovascular: RRR, distal pulses 2+ ?Abdomen: Gravid, non-tender ?Leopolds:vertex  ?Genitourinary: not assessed ?Extremities: no edema, erythema, or tenderness, DTR's +2  ?Neurologic: Grossly intact ?Psychiatric: mood appropriate, affect full ? ?Labs: ?Results for orders  placed or performed during the hospital encounter of 06/12/21 (from the past 24 hour(s))  ?Protein / creatinine ratio, urine     Status: None  ? Collection Time: 06/12/21 11:49 AM  ?Result Value Ref Range  ? Creatinine, Urine 25 mg/dL  ? Total Protein, Urine <6 mg/dL  ? Protein Creatinine Ratio        0.00 - 0.15 mg/mg[Cre]  ?Comprehensive metabolic panel     Status: Abnormal  ? Collection Time: 06/12/21 12:40 PM  ?Result Value Ref Range  ? Sodium 135 135 - 145 mmol/L  ? Potassium 4.1 3.5 - 5.1 mmol/L  ? Chloride 106 98 - 111 mmol/L  ? CO2 24 22 - 32 mmol/L  ? Glucose, Bld 89 70 - 99 mg/dL  ? BUN 5 (L) 6 - 20 mg/dL  ? Creatinine, Ser 0.55 0.44 - 1.00 mg/dL  ? Calcium 9.3 8.9 - 10.3 mg/dL  ? Total Protein 6.5 6.5 - 8.1 g/dL  ? Albumin 3.2 (L) 3.5 - 5.0 g/dL  ? AST 21 15 - 41 U/L  ? ALT 14 0 - 44 U/L  ? Alkaline Phosphatase 139 (H) 38 - 126 U/L  ? Total Bilirubin 0.6 0.3 - 1.2 mg/dL  ? GFR, Estimated >60 >60 mL/min  ? Anion gap 5 5 - 15  ? ? ?Assessment: 26 y.o. G1P0 [redacted]w[redacted]d by 07/15/2021, by Ultrasound in obs for a headache  ? ?Plan: ?1) Preeclampsia labs, Fioricet PO, Compazine 10mg  IV, Magnesium Oxide PO, consult neuro if no improvement with headache.  ? ?2) Fetus -category 1 tracing  ? ?3) PNL - Blood type O/Positive/-- (09/30 1113) / Anti-bodyscreen Negative (09/30 1113) / Rubella 4.17 (09/30 1113) / Varicella immune / RPR Non Reactive (01/20 0931) / HBsAg Negative (09/30 1113) / HIV Non Reactive (01/20 0931) / 1-hr OGTT 87 / GBS  unk ? ?4)  Immunization History - declined Flu and TDAP  ? ?There is no immunization history on file for this patient. ? ?5) Disposition - plan discharge once HA resolves  ? ?03-08-1988 CNM  ?[facility], [facility]

## 2021-06-12 NOTE — OB Triage Note (Signed)
Pt Sherry Lutz 26 y.o. presents to labor and delivery triage reporting headaches. Pt is a G1P0 at [redacted]w[redacted]d . Pt denies signs and symptons consistent with rupture of membranes or active vaginal bleeding. Pt denies contractions and states positive fetal movement. Pt reports 6/10 "throbbing headache". Pt took 1000mg  of Tylenol this morning at 0700 with no relief to the h/a. Pt was at work this morning when the CNM told her to come to triage to get evaluated. External FM and TOCO applied to non-tender abdomen and assessing. Initial FHR 125 . Vital signs obtained and within normal limits. Provider notified of pt. ? ?

## 2021-06-12 NOTE — Progress Notes (Signed)
Cassie continues to have HA despite Fioricet, Compazine and magnesium oxide. Neuro consult placed.  ? ?Carie Caddy, CNM  ?[facility], [facility]  ?@TODAY @  ?3:06 PM  ? ?

## 2021-06-12 NOTE — Discharge Summary (Signed)
Physician Final Progress Note ? ?Patient ID: ?Sherry Lutz ?MRN: [id number] ?DOB/AGE: 09-17-1995 26 y.o. ? ?Admit date: 06/12/2021 ?Admitting provider: Gae Dry, MD ?Discharge date: 06/12/2021 ? ? ?Admission Diagnoses:  ?1) intrauterine pregnancy at [redacted]w[redacted]d  ?2) Headache  ? ?Discharge Diagnoses:  ?Principal Problem: ?  Headache in pregnancy ?Active Problems: ?  Headache ?  ? ?History of Present Illness:  26 y.o. G1P0 [redacted]w[redacted]d by 07/15/2021, by 10wk Ultrasound presenting to L&D for a Headache since Saturday. The headache started on Saturday, at first it was located in the front but now "it is all over", it is constant, it feels like pressure and is throbbing. She rates it a "6-7/10" She has been taking Tylenol ATC with little relief.  She was seen in the clinic on 3/20, she was instructed to take Tylenol, Sudafed and a can of coke at once. She has repeated this cocktail and has not gotten relief.   She was able to sleep last night but had the normal pregnancy sleep disruptions. Denies any nasal congestion or pain in her sinuses. She has had sensitivity to light and noise.  She has also felt dizzy over the last 3 days. Labs were collected on 3/20, her UPC was >300, when the pt was notified about her labs, she reported that she still had a HA and it has gotten worse, she was instructed to come in for an evaluation on L and D.  ? ?Past Medical History:  ?Diagnosis Date  ? Asthma   ? RSD (reflex sympathetic dystrophy)   ? foot  ? ? ?Past Surgical History:  ?Procedure Laterality Date  ? INTERCOSTAL NERVE BLOCK    ? ? ?No current facility-administered medications on file prior to encounter.  ? ?Current Outpatient Medications on File Prior to Encounter  ?Medication Sig Dispense Refill  ? acetaminophen (TYLENOL) 650 MG CR tablet Take 1,000 mg by mouth every 8 (eight) hours as needed for pain.    ? doxylamine, Sleep, (UNISOM) 25 MG tablet Take 12.5 mg by mouth at bedtime as needed (for nausea).    ? ferrous sulfate 325 (65  FE) MG EC tablet Take 325 mg by mouth 3 (three) times daily with meals.    ? montelukast (SINGULAIR) 10 MG tablet Take 10 mg by mouth daily.    ? Prenatal w/o A Vit-Fe Fum-FA (PRENATAL VITAMIN W/FE, FA) 29-1 MG CHEW     ? Cholecalciferol 50 MCG (2000 UT) CAPS Take by mouth.    ? fluticasone (FLOVENT HFA) 110 MCG/ACT inhaler Inhale into the lungs.    ? ? ?Allergies  ?Allergen Reactions  ? Pregabalin   ?  Other reaction(s): Hallucinations  ? Red Dye Rash  ? ? ?Social History  ? ?Socioeconomic History  ? Marital status: Married  ?  Spouse name: Not on file  ? Number of children: Not on file  ? Years of education: Not on file  ? Highest education level: Not on file  ?Occupational History  ? Not on file  ?Tobacco Use  ? Smoking status: Never  ? Smokeless tobacco: Never  ?Vaping Use  ? Vaping Use: Never used  ?Substance and Sexual Activity  ? Alcohol use: No  ? Drug use: No  ? Sexual activity: Yes  ?  Birth control/protection: Pill  ?Other Topics Concern  ? Not on file  ?Social History Narrative  ? Lives with husband  ? ?Social Determinants of Health  ? ?Financial Resource Strain: Not on file  ?Food Insecurity: Not  on file  ?Transportation Needs: Not on file  ?Physical Activity: Not on file  ?Stress: Not on file  ?Social Connections: Not on file  ?Intimate Partner Violence: Not on file  ? ? ?Family History  ?Problem Relation Age of Onset  ? Healthy Mother   ? Healthy Father   ?  ? ?Review of Systems  ?Constitutional:  Negative for chills, fever and malaise/fatigue.  ?Eyes:  Positive for blurred vision and photophobia. Negative for pain, discharge and redness.  ?Respiratory: Negative.    ?Cardiovascular: Negative.   ?Gastrointestinal: Negative.   ?Genitourinary: Negative.   ?Musculoskeletal: Negative.   ?Neurological:  Positive for dizziness and headaches. Negative for tingling, sensory change, speech change and focal weakness.  ?Psychiatric/Behavioral: Negative.     ? ?Physical Exam: ?BP 121/68 (BP Location: Left Arm)    Pulse 95   Temp 98.2 ?F (36.8 ?C) (Oral)   Resp 18   LMP 09/27/2020   ?Physical Exam ?Constitutional:   ?   Appearance: She is well-developed.  ?Cardiovascular:  ?   Rate and Rhythm: Normal rate and regular rhythm.  ?Pulmonary:  ?   Effort: Pulmonary effort is normal.  ?   Breath sounds: Normal breath sounds.  ?Abdominal:  ?   Palpations: Abdomen is soft.  ?   Tenderness: There is no abdominal tenderness.  ?   Comments: gravid  ?Neurological:  ?   Mental Status: She is alert.  ?   Deep Tendon Reflexes: Reflexes normal.  ?Skin: ?   General: Skin is warm.  ?Psychiatric:     ?   Mood and Affect: Mood normal.     ?   Behavior: Behavior normal.  ? ? ?Reactive NST ?FHT: baseline 125, moderate variability, pos accel, neg decel  ?Toco: irregular contractions, mild  ?  ? ?Consults: neurology ? ?Significant Findings/ Diagnostic Studies: labs: CMP and UPC WNL  ? ?Procedures: reactive NST  ? ?Hospital Course: The patient was admitted to Labor and Delivery Triage for observation. For headaches. She was given Fioricet, Compazine IV, magnesium oxide PO, was then evaluated by Neuro who recommended Magnesium Sulfate IV, Dexamethazone IV and an IVF bolus of NS. MRI was discussed, Cassie preferred to see what would happen after the magnesium and dexamethazone.  Headache nearly resolving, it is no  longer constant it is now a "2/10".  Cassie desires to go home.  Would be open to an MRI if the headache returns.  ? ?Discharge Condition: stable ? ?Disposition: Discharge disposition: 01-Home or Self Care ? ? ? ? ?discharge home  ?Keep next [place] on 3/27 ?Reviewed Magnesium oxide, Coenzyme Q-10 and riboflavin for headache prevention.  ?Instructed to return to hospital if headache if headache returns.  ? ?Diet: Regular diet ? ?Discharge Activity: Activity as tolerated ? ? ?Allergies as of 06/12/2021   ? ?   Reactions  ? Pregabalin   ? Other reaction(s): Hallucinations  ? Red Dye Rash  ? ?  ? ?  ?Medication List  ?  ? ?TAKE these medications    ? ?acetaminophen 650 MG CR tablet ?Commonly known as: TYLENOL ?Take 1,000 mg by mouth every 8 (eight) hours as needed for pain. ?  ?Cholecalciferol 50 MCG (2000 UT) Caps ?Take by mouth. ?  ?doxylamine (Sleep) 25 MG tablet ?Commonly known as: UNISOM ?Take 12.5 mg by mouth at bedtime as needed (for nausea). ?  ?ferrous sulfate 325 (65 FE) MG EC tablet ?Take 325 mg by mouth 3 (three) times daily with meals. ?  ?  fluticasone 110 MCG/ACT inhaler ?Commonly known as: FLOVENT HFA ?Inhale into the lungs. ?  ?montelukast 10 MG tablet ?Commonly known as: SINGULAIR ?Take 10 mg by mouth daily. ?  ?prenatal vitamin w/FE, FA 29-1 MG Chew ?  ? ?  ? ? ? ? ?Signed: ?Jillene Bucks Grier Vu, CNM  ?06/12/2021, 4:49 PM  ?

## 2021-06-12 NOTE — Consult Note (Signed)
Neurology Consultation ? ?Reason for Consult: Intractable headache ?Referring Physician: Carie Caddy, CNM ? ?CC: Headache ? ?History is obtained from: Patient, chart ? ?HPI: Sherry Lutz is a 26 y.o. 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  She describes that a headache started about a week ago and has not gotten much better in spite of some over-the-counter treatment. ?She describes the headache as a persistent pain in and around her forehead and the top of the head.  No pain in the back of the head.  No pain in the eyes.  No pain while moving her eyes.  No improvement or worsening of the headache with position specifically laying down, bending forward, standing up or moving her head. ?Denies history of migraines in the past.  Denies any prior history of bleeding or clotting disorder or family history of bleeding or clotting disorder. ?Denies any preceding infectious symptoms. ?Does not consume caffeine.  Even before pregnancy only had a few caffeinated soda pops but not a consistent or large caffeine intake. ?Was put on an iron pill a little while ago but the headache started much after. ?Denies any shortness of breath chest pain.  Denies any visual auras ?Denies any transient visual obscurations.  Denies any pulsatile tinnitus ? ?In the labor and delivery unit, blood pressure has been in the normal range, she has been given Fioricet x1, magnesium oxide 400 mg x 1 without any improvement.  She is also received Compazine injection without relief. ? ?ROS: Full ROS was performed and is negative except as noted in the HPI.    ? ?Past Medical History:  ?Diagnosis Date  ? Asthma   ? RSD (reflex sympathetic dystrophy)   ? foot  ? ?Family History  ?Problem Relation Age of Onset  ? Healthy Mother   ? Healthy Father   ? ?Social History:  ? reports that she has never smoked. She has never used  smokeless tobacco. She reports that she does not drink alcohol and does not use drugs. ? ?Medications ? ?Current Facility-Administered Medications:  ?  prochlorperazine (COMPAZINE) injection 10 mg, 10 mg, Intravenous, Q6H PRN, Dominic, Courtney Heys, CNM, 10 mg at 06/12/21 1344 ? ? ?Exam: ?Current vital signs: ?BP 121/68 (BP Location: Left Arm)   Pulse 95   Temp 98.2 ?F (36.8 ?C) (Oral)   Resp 18   LMP 09/27/2020  ?Vital signs in last 24 hours: ?Temp:  [98.2 ?F (36.8 ?C)] 98.2 ?F (36.8 ?C) (03/22 1146) ?Pulse Rate:  [85-95] 95 (03/22 1348) ?Resp:  [18] 18 (03/22 1146) ?BP: (118-123)/(65-71) 121/68 (03/22 1348) ?General: Awake alert in no distress ?HEENT: Normocephalic, atraumatic, dry oral mucous membranes ?Lungs: Clear ?Cardiovascular: Regular rate rhythm ?Abdomen: Gravid, nontender ?Extremities warm well perfused with some tenderness on the left foot which is from her RSD. ?Neurologic exam ?Awake alert oriented x3 ?No evidence of aphasia ?No evidence of dysarthria ?Cranial nerves II to XII intact.  Funduscopic examination limited by the pupil size but no gross disc margin obscuration on my exam bilaterally. ?Motor examination with no weakness-5/5 without vertical drift in upper and lower extremity bilaterally. ?Sensation intact to light touch all over except for mildly diminished sensation on the left leg which is her baseline from her RSD. ?Coordination exam with no dysmetria ?NIH stroke scale-0 ? ?Labs ?I have reviewed labs in epic and the results pertinent to this consultation are: ? ?  CBC ?   ?Component Value Date/Time  ? WBC 8.3 06/10/2021 1008  ? WBC 6.2 08/29/2019 1550  ? RBC 3.71 (L) 06/10/2021 1008  ? RBC 4.22 08/29/2019 1550  ? HGB 11.5 06/10/2021 1008  ? HCT 34.7 06/10/2021 1008  ? PLT 183 06/10/2021 1008  ? MCV 94 06/10/2021 1008  ? MCH 31.0 06/10/2021 1008  ? MCH 29.1 08/29/2019 1550  ? MCHC 33.1 06/10/2021 1008  ? MCHC 32.9 08/29/2019 1550  ? RDW 12.2 06/10/2021 1008  ? LYMPHSABS 1.4 06/10/2021 1008   ? MONOABS 0.3 08/29/2019 1550  ? EOSABS 0.1 06/10/2021 1008  ? BASOSABS 0.0 06/10/2021 1008  ? ? ?CMP  ?   ?Component Value Date/Time  ? NA 135 06/12/2021 1240  ? NA 141 06/10/2021 1008  ? K 4.1 06/12/2021 1240  ? CL 106 06/12/2021 1240  ? CO2 24 06/12/2021 1240  ? GLUCOSE 89 06/12/2021 1240  ? BUN 5 (L) 06/12/2021 1240  ? BUN 4 (L) 06/10/2021 1008  ? CREATININE 0.55 06/12/2021 1240  ? CALCIUM 9.3 06/12/2021 1240  ? PROT 6.5 06/12/2021 1240  ? PROT 6.0 06/10/2021 1008  ? ALBUMIN 3.2 (L) 06/12/2021 1240  ? ALBUMIN 3.6 (L) 06/10/2021 1008  ? AST 21 06/12/2021 1240  ? ALT 14 06/12/2021 1240  ? ALKPHOS 139 (H) 06/12/2021 1240  ? BILITOT 0.6 06/12/2021 1240  ? BILITOT <0.2 06/10/2021 1008  ? GFRNONAA >60 06/12/2021 1240  ? GFRAA >60 04/02/2017 1935  ? ?Imaging ?I have reviewed the images obtained: ?No imaging ? ?Assessment:  ?26 year old G1, P0, [redacted] weeks pregnant, presenting for evaluation of headache that started about a week ago and has not improved much in spite of Tylenol, and in the labor and delivery unit being treated with Compazine, Fioricet and magnesium oxide p.o. ?Her exam looks reassuring but the fact that the headache is not being relieved for about a week is a red flag.  No evidence of papilledema.  History not suggestive of migraines.  Features of headache described also not suggestive of idiopathic intracranial hypertension. ?I discussed the plan of getting an MRI of the brain without contrast and MR venogram of the head without contrast which she is amenable to but she asked me if there is something that we can try in terms of medication before she gets imaging as she is a little hesitant to go inside the MRI scanner. ?My recommendations are below ? ?Impression: ?-Somewhat intractable headache in pregnancy-does not have migrainous features or features of IIHT. ?-Due to the persistence of headache, imaging in the form of MRI of the brain without contrast and MRV of the head without contrast would be  helpful to rule out a structural cause as well as dural venous sinus thrombus.  Patient would first like to try medical management prior to going for imaging which I do not disagree with as her exam is very benign. ? ?Recommendations: ?-Trial of IV magnesium ?-Trial of dexamethasone 10 mg IV x1 ?-IV fluids ?-If symptoms improve with the above, given her reassuring exam, hold off on imaging unless symptoms recur. ?-If the symptoms do not improve with the above, please order MRI of the brain without contrast and MR venogram (MRV) of the head without contrast ?-Stat imaging if she has any worsening of headache, focal neurological deficits-tingling numbness weakness or visual changes or any change in her neurological exam including pupillary size asymmetry. ? ?Plan was discussed with Carie Caddy, CNM and Oneta Rack RN, in addition to discussing  with the patient and her family member at bedside. ? ?-- ?Milon DikesAshish Suleyman Ehrman, MD ?Neurologist ?[facility] ?Pager: [phone] ?

## 2021-06-17 ENCOUNTER — Encounter: Payer: Self-pay | Admitting: Licensed Practical Nurse

## 2021-06-17 ENCOUNTER — Other Ambulatory Visit: Payer: Self-pay

## 2021-06-17 ENCOUNTER — Other Ambulatory Visit: Payer: 59

## 2021-06-17 ENCOUNTER — Ambulatory Visit (INDEPENDENT_AMBULATORY_CARE_PROVIDER_SITE_OTHER): Payer: 59 | Admitting: Licensed Practical Nurse

## 2021-06-17 ENCOUNTER — Other Ambulatory Visit (HOSPITAL_COMMUNITY)
Admission: RE | Admit: 2021-06-17 | Discharge: 2021-06-17 | Disposition: A | Discharge status: Home / Self Care | Payer: 59 | Source: Ambulatory Visit | Attending: Licensed Practical Nurse | Admitting: Licensed Practical Nurse

## 2021-06-17 VITALS — BP 100/60 | Wt 160.0 lb

## 2021-06-17 DIAGNOSIS — Z3685 Encounter for antenatal screening for Streptococcus B: Secondary | ICD-10-CM

## 2021-06-17 DIAGNOSIS — Z3A36 36 weeks gestation of pregnancy: Secondary | ICD-10-CM

## 2021-06-17 DIAGNOSIS — R519 Headache, unspecified: Secondary | ICD-10-CM

## 2021-06-17 DIAGNOSIS — Z113 Encounter for screening for infections with a predominantly sexual mode of transmission: Secondary | ICD-10-CM | POA: Insufficient documentation

## 2021-06-17 DIAGNOSIS — Z3400 Encounter for supervision of normal first pregnancy, unspecified trimester: Secondary | ICD-10-CM

## 2021-06-17 DIAGNOSIS — O26893 Other specified pregnancy related conditions, third trimester: Secondary | ICD-10-CM

## 2021-06-17 LAB — POCT URINALYSIS DIPSTICK OB
Glucose, UA: NEGATIVE
POC,PROTEIN,UA: NEGATIVE

## 2021-06-17 NOTE — Progress Notes (Signed)
Routine Prenatal Care Visit ? ?Subjective  ?Sherry Lutz is a 26 y.o. G1P0 at 107w0d being seen today for ongoing prenatal care.  She is currently monitored for the following issues for this low-risk pregnancy and has Reactive airway disease; Complex regional pain syndrome I of other specified site; RSD (reflex sympathetic dystrophy); Supervision of normal pregnancy; Headache; and Headache in pregnancy on their problem list.  ?----------------------------------------------------------------------------------- ?Patient reports headache.  Here with mother.  Was evaluated at [facility] on 3/22 for persistent HA (labs WNL including UPC too low to calculate but UPC 362 on 3/20)  BP have all been normal.  Reports she has had a HA daily since being discharged. The HA is sometimes in the front with pressure other tiems it is on the side of her head, sometimes the HA is so bad light and nois bother her. Tylenol helps a little. Today is the first day she has not had a HA. She has been taking Magnesium, Riboflavin Coenzyme Q 10 since last week. She was evaluated by Neuro on 3/2, they recommended MRI if HA persists.  Pt resistant as she feels it is not necessary. Is worried she could have preeclampsia given that she had an elevated UPC.  She has been checking her BP at home, they have all been normal.   ?-Will return tomorrow for repeat labs-unable to obtain today as it is after hours and lab tech not present ?-Will go directly to ED if severe HA develops ?-Agreeable to MRI ? ?Contractions: Irritability. Vag. Bleeding: None.  Movement: Present. Leaking Fluid denies.  ?----------------------------------------------------------------------------------- ?The following portions of the patient's history were reviewed and updated as appropriate: allergies, current medications, past family history, past medical history, past social history, past surgical history and problem list. Problem list updated. ? ?Objective  ?Blood pressure  100/60, weight 160 lb (72.6 kg), last menstrual period 09/27/2020. ?Pregravid weight 134 lb (60.8 kg) Total Weight Gain 26 lb (11.8 kg) ?Urinalysis: Urine Protein Negative  Urine Glucose Negative ? ?Fetal Status: Fetal Heart Rate (bpm): 125 Fundal Height: 36 cm Movement: Present    ? ?General:  Alert, oriented and cooperative. Patient is in no acute distress.  ?Skin: Skin is warm and dry. No rash noted.   ?Cardiovascular: Normal heart rate noted  ?Respiratory: Normal respiratory effort, no problems with respiration noted  ?Abdomen: Soft, gravid, appropriate for gestational age. Pain/Pressure: Present     ?Pelvic:  Cervical exam performed Dilation: Closed Effacement (%): 50 Station: -2  ?Extremities: Normal range of motion.  Edema: None  ?Mental Status: Normal mood and affect. Normal behavior. Normal judgment and thought content.  ? ?Assessment  ? ?26 y.o. G1P0 at [redacted]w[redacted]d by  07/15/2021, by Ultrasound presenting for routine prenatal visit ? ?Plan  ? ?pregnancy1  Problems (from 12/10/20 to present)   ? ? Problem Noted Resolved  ? Headache 06/12/2021 by Allen Derry, CNM No  ? Supervision of normal pregnancy 12/10/2020 by Rod Can, CNM No  ? Overview Addendum 04/29/2021  9:34 AM by Allen Derry, CNM  ?   ?Nursing Staff Provider  ?Office Location  [facility] Dating  LMP, [place]  ?Language  Media planner [place]  [facility] nml  ?Flu Vaccine  Declines Genetic Screen  NIPS: nml XY  ?TDaP vaccine    Hgb A1C or  ?GTT Early : NA ?Third trimester : 11  ?Covid    LAB RESULTS   ?Rhogam  n/a Blood Type O/Positive/-- (09/30 1113)   ?Feeding Plan Breast Antibody Negative (09/30  1113)  ?Contraception POP Rubella 4.17 (09/30 1113)  ?Circumcision Yes? RPR Non Reactive (09/30 1113)   ?Pediatrician  [place] [facility] HBsAg Negative (09/30 1113)   ?Support Person Husband Matt HIV Non Reactive (09/30 1113)  ?Prenatal Classes  Varicella Imm  ?  GBS  (For PCN allergy, check sensitivities)   ?BTL Consent no    ?VBAC Consent n/a Pap 2021 negative    ?     ?Pelvis Tested no    ? ? ?  ?  ? ?  ?  ? ?Preterm labor symptoms and general obstetric precautions including but not limited to vaginal bleeding, contractions, leaking of fluid and fetal movement were reviewed in detail with the patient. ?Please refer to After Visit Summary for other counseling recommendations.  ? ?Return in about 1 week (around 06/24/2021) for [place]. ?36 swabs collected ?Returning 3/27 for repeat  preeclampsia labs ?MRI ordered ?Dr Gilman Schmidt called, reviewed pt's history and plan, rec out patient neuro consult-order placed, given normal blood pressures less concerned for preeclampsia, however we will review week by week, if headaches persist may need to consider IOL for atypical presentation of preeclampsia.  ? ?Roberto Scales, CNM  ?Mosetta Pigeon, [facility]  ?06/17/21  ?5:43 PM  ? ? ?

## 2021-06-18 ENCOUNTER — Encounter: Payer: Self-pay | Admitting: Advanced Practice Midwife

## 2021-06-18 ENCOUNTER — Other Ambulatory Visit: Payer: 59

## 2021-06-18 ENCOUNTER — Ambulatory Visit (INDEPENDENT_AMBULATORY_CARE_PROVIDER_SITE_OTHER): Payer: 59 | Admitting: Advanced Practice Midwife

## 2021-06-18 VITALS — BP 118/70 | Wt 159.0 lb

## 2021-06-18 DIAGNOSIS — Z3400 Encounter for supervision of normal first pregnancy, unspecified trimester: Secondary | ICD-10-CM

## 2021-06-18 DIAGNOSIS — Z3403 Encounter for supervision of normal first pregnancy, third trimester: Secondary | ICD-10-CM

## 2021-06-18 DIAGNOSIS — Z3A36 36 weeks gestation of pregnancy: Secondary | ICD-10-CM

## 2021-06-18 DIAGNOSIS — O26893 Other specified pregnancy related conditions, third trimester: Secondary | ICD-10-CM

## 2021-06-18 NOTE — Progress Notes (Signed)
Routine Prenatal Care Visit ? ?Subjective  ?Sherry Lutz is a 26 y.o. G1P0 at [redacted]w[redacted]d being seen today for ongoing prenatal care.  She is currently monitored for the following issues for this low-risk pregnancy and has Reactive airway disease; Complex regional pain syndrome I of other specified site; RSD (reflex sympathetic dystrophy); Supervision of normal pregnancy; Headache; and Headache in pregnancy on their problem list.  ?----------------------------------------------------------------------------------- ?Patient reports spotting and cramping last night and this morning. The discharge is brown this morning. Cramping was constant last night. She also has low back pain. She continues to have headaches daily and is now taking magnesium co q 10 and B2 combination. She is worried due to recent diagnosis of preeclampsia. Reassurance given of normal blood pressures.  ?Contractions: Irritability. Vag. Bleeding: Scant.  Movement: Present. Leaking Fluid denies.  ?----------------------------------------------------------------------------------- ?The following portions of the patient's history were reviewed and updated as appropriate: allergies, current medications, past family history, past medical history, past social history, past surgical history and problem list. Problem list updated. ? ?Objective  ?Blood pressure 118/70, weight 159 lb (72.1 kg), last menstrual period 09/27/2020. ?Pregravid weight 134 lb (60.8 kg) Total Weight Gain 25 lb (11.3 kg) ?Urinalysis: Urine Protein    Urine Glucose   ? ?Fetal Status: Fetal Heart Rate (bpm): 141   Movement: Present    ? ?General:  Alert, oriented and cooperative. Patient is in no acute distress.  ?Skin: Skin is warm and dry. No rash noted.   ?Cardiovascular: Normal heart rate noted  ?Respiratory: Normal respiratory effort, no problems with respiration noted  ?Abdomen: Soft, gravid, appropriate for gestational age. Pain/Pressure: Present     ?Pelvic:  Cervical exam  performed Dilation: Closed Effacement (%): 50 Station: -2, thin white discharge with a hint of tan color, nitrazine negative  ?Extremities: Normal range of motion.  Edema: None  ?Mental Status: Normal mood and affect. Normal behavior. Normal judgment and thought content.  ? ?Assessment  ? ?26 y.o. G1P0 at [redacted]w[redacted]d by  07/15/2021, by Ultrasound presenting for work-in prenatal visit ? ?Plan  ? ?pregnancy1  Problems (from 12/10/20 to present)   ? Problem Noted Resolved  ? Headache 06/12/2021 by Ellwood Sayers, CNM No  ? Supervision of normal pregnancy 12/10/2020 by Tresea Mall, CNM No  ? Overview Addendum 04/29/2021  9:34 AM by Ellwood Sayers, CNM  ?   ?Nursing Staff Provider  ?Office Location  [facility] Dating  LMP, [place]  ?Language  Ambulance person [place]  [facility] nml  ?Flu Vaccine  Declines Genetic Screen  NIPS: nml XY  ?TDaP vaccine    Hgb A1C or  ?GTT Early : NA ?Third trimester : 69  ?Covid    LAB RESULTS   ?Rhogam  n/a Blood Type O/Positive/-- (09/30 1113)   ?Feeding Plan Breast Antibody Negative (09/30 1113)  ?Contraception POP Rubella 4.17 (09/30 1113)  ?Circumcision Yes? RPR Non Reactive (09/30 1113)   ?Pediatrician  [place] [facility] HBsAg Negative (09/30 1113)   ?Support Person Husband Matt HIV Non Reactive (09/30 1113)  ?Prenatal Classes  Varicella Imm  ?  GBS  (For PCN allergy, check sensitivities)   ?BTL Consent no    ?VBAC Consent n/a Pap 2021 negative   ?     ?Pelvis Tested no    ? ? ?  ?  ?  ?Increase hydration  ? ?Preterm labor symptoms and general obstetric precautions including but not limited to vaginal bleeding, contractions, leaking of fluid and fetal movement were reviewed in detail with  the patient. ?Please refer to After Visit Summary for other counseling recommendations.  ? ?Return for scheduled appointment. ? ?Tresea Mall, CNM ?06/18/2021 9:18 AM   ? ?

## 2021-06-18 NOTE — Progress Notes (Signed)
Pt has had some spotting starting this AM. Pt also had some vaginal d/c. Pt had cervical check yesterday  ?

## 2021-06-19 ENCOUNTER — Telehealth: Payer: Self-pay | Admitting: Licensed Practical Nurse

## 2021-06-19 LAB — CBC WITH DIFFERENTIAL/PLATELET
Basophils Absolute: 0 10*3/uL (ref 0.0–0.2)
Basos: 0 %
EOS (ABSOLUTE): 0.1 10*3/uL (ref 0.0–0.4)
Eos: 1 %
Hematocrit: 34.7 % (ref 34.0–46.6)
Hemoglobin: 11.5 g/dL (ref 11.1–15.9)
Immature Grans (Abs): 0.1 10*3/uL (ref 0.0–0.1)
Immature Granulocytes: 1 %
Lymphocytes Absolute: 1.6 10*3/uL (ref 0.7–3.1)
Lymphs: 18 %
MCH: 30.9 pg (ref 26.6–33.0)
MCHC: 33.1 g/dL (ref 31.5–35.7)
MCV: 93 fL (ref 79–97)
Monocytes Absolute: 0.5 10*3/uL (ref 0.1–0.9)
Monocytes: 6 %
Neutrophils Absolute: 6.4 10*3/uL (ref 1.4–7.0)
Neutrophils: 74 %
Platelets: 185 10*3/uL (ref 150–450)
RBC: 3.72 x10E6/uL — ABNORMAL LOW (ref 3.77–5.28)
RDW: 12.4 % (ref 11.7–15.4)
WBC: 8.7 10*3/uL (ref 3.4–10.8)

## 2021-06-19 LAB — COMPREHENSIVE METABOLIC PANEL
ALT: 13 IU/L (ref 0–32)
AST: 20 IU/L (ref 0–40)
Albumin/Globulin Ratio: 1.4 (ref 1.2–2.2)
Albumin: 3.5 g/dL — ABNORMAL LOW (ref 3.9–5.0)
Alkaline Phosphatase: 184 IU/L — ABNORMAL HIGH (ref 44–121)
BUN/Creatinine Ratio: 5 — ABNORMAL LOW (ref 9–23)
BUN: 3 mg/dL — ABNORMAL LOW (ref 6–20)
Bilirubin Total: 0.2 mg/dL (ref 0.0–1.2)
CO2: 19 mmol/L — ABNORMAL LOW (ref 20–29)
Calcium: 9.3 mg/dL (ref 8.7–10.2)
Chloride: 104 mmol/L (ref 96–106)
Creatinine, Ser: 0.56 mg/dL — ABNORMAL LOW (ref 0.57–1.00)
Globulin, Total: 2.5 g/dL (ref 1.5–4.5)
Glucose: 116 mg/dL — ABNORMAL HIGH (ref 70–99)
Potassium: 3.5 mmol/L (ref 3.5–5.2)
Sodium: 139 mmol/L (ref 134–144)
Total Protein: 6 g/dL (ref 6.0–8.5)
eGFR: 130 mL/min/{1.73_m2} (ref >59)

## 2021-06-19 LAB — CERVICOVAGINAL ANCILLARY ONLY
Chlamydia: NEGATIVE
Comment: NEGATIVE
Comment: NORMAL
Neisseria Gonorrhea: NEGATIVE

## 2021-06-19 LAB — PROTEIN / CREATININE RATIO, URINE
Creatinine, Urine: 32.8 mg/dL
Protein, Ur: 10.2 mg/dL
Protein/Creat Ratio: 311 mg/g creat — ABNORMAL HIGH (ref 0–200)

## 2021-06-19 NOTE — Telephone Encounter (Signed)
Reviewed pt's hx and recent labs with Dr Tiburcio Pea, CMP and CBC WNL UPC 311.  Blood pressures all WNL.  Has MRI scheduled for Friday and Neuro consult ordered.  Most likely not preeclampsia, however if HA persists a case can be made to recommend IOL at after 37 weeks.  ? ?TC to Sherry Lutz.  Sherry Lutz reports she had a bad HA yesterday but today she has been fine-had a minor HA in the morning that went away on it's own.  Mentioned that she had some brown spotting when she came in to give the urine sample.  Reviewed lab results and discussion with Dr Tiburcio Pea.  Sherry Lutz to keep MRI apt and will call to schedule a Neuro consult-they have called and she needs to call them back. ? ?Next ROB with this CNM on 4/3 ?Carie Caddy, CNM  ?Domingo Pulse, [facility]  ?@TODAY @  ?5:02 PM  ? ?

## 2021-06-21 ENCOUNTER — Ambulatory Visit
Admission: RE | Admit: 2021-06-21 | Discharge: 2021-06-21 | Disposition: A | Discharge status: Home / Self Care | Payer: 59 | Source: Ambulatory Visit | Attending: Licensed Practical Nurse | Admitting: Licensed Practical Nurse

## 2021-06-21 DIAGNOSIS — Z3400 Encounter for supervision of normal first pregnancy, unspecified trimester: Secondary | ICD-10-CM

## 2021-06-21 DIAGNOSIS — R519 Other specified pregnancy related conditions, third trimester: Secondary | ICD-10-CM

## 2021-06-21 LAB — CULTURE, BETA STREP (GROUP B ONLY): Strep Gp B Culture: NEGATIVE

## 2021-06-24 ENCOUNTER — Ambulatory Visit (INDEPENDENT_AMBULATORY_CARE_PROVIDER_SITE_OTHER): Payer: 59 | Admitting: Licensed Practical Nurse

## 2021-06-24 ENCOUNTER — Encounter: Payer: Self-pay | Admitting: Licensed Practical Nurse

## 2021-06-24 VITALS — BP 120/60 | Wt 159.0 lb

## 2021-06-24 DIAGNOSIS — R519 Headache, unspecified: Secondary | ICD-10-CM

## 2021-06-24 DIAGNOSIS — Z3403 Encounter for supervision of normal first pregnancy, third trimester: Secondary | ICD-10-CM

## 2021-06-24 DIAGNOSIS — Z3A37 37 weeks gestation of pregnancy: Secondary | ICD-10-CM | POA: Diagnosis not present

## 2021-06-24 LAB — POCT URINALYSIS DIPSTICK OB
Glucose, UA: NEGATIVE
POC,PROTEIN,UA: NEGATIVE

## 2021-06-24 LAB — FETAL NONSTRESS TEST

## 2021-06-24 NOTE — Progress Notes (Signed)
Routine Prenatal Care Visit ? ?Subjective  ?Sherry Lutz is a 27 y.o. G1P0 at [redacted]w[redacted]d being seen today for ongoing prenatal care.  She is currently monitored for the following issues for this low-risk pregnancy and has Reactive airway disease; Complex regional pain syndrome I of other specified site; RSD (reflex sympathetic dystrophy); Supervision of normal pregnancy; Headache; and Headache in pregnancy on their problem list.  ?----------------------------------------------------------------------------------- ?Patient reports  continues to have severe headaches daily, has been taking Tylenol with minimal relief.  She has been able to sleep but today the HA woke her up from her sleep. MRI /MRV WNL.  As discussed at previous visits, if HA continue then IOL can be considered, pt desires IOL.  Anticipatory guidance given.  ?Contractions: Irritability. Vag. Bleeding: None.  Movement: Present. Leaking Fluid denies.  ?----------------------------------------------------------------------------------- ?The following portions of the patient's history were reviewed and updated as appropriate: allergies, current medications, past family history, past medical history, past social history, past surgical history and problem list. Problem list updated. ? ?Objective  ?Blood pressure 120/60, weight 159 lb (72.1 kg), last menstrual period 09/27/2020. ?Pregravid weight 134 lb (60.8 kg) Total Weight Gain 25 lb (11.3 kg) ?Urinalysis: Urine Protein Negative  Urine Glucose Negative ? ?Fetal Status:     Movement: Present    ?RNST  ?Baseline 135, moderate variability, pos accel, neg decel   ?General:  Alert, oriented and cooperative. Patient is in no acute distress.  ?Skin: Skin is warm and dry. No rash noted.   ?Cardiovascular: Normal heart rate noted  ?Respiratory: Normal respiratory effort, no problems with respiration noted  ?Abdomen: Soft, gravid, appropriate for gestational age. Pain/Pressure: Present     ?Pelvic:  Cervical exam  performed Dilation: 1 Effacement (%): 50 Station: -1  ?Extremities: Normal range of motion.     ?Mental Status: Normal mood and affect. Normal behavior. Normal judgment and thought content.  ? ?Assessment  ? ?26 y.o. G1P0 at [redacted]w[redacted]d by  07/15/2021, by Ultrasound presenting for routine prenatal visit ? ?Plan  ? ?pregnancy1  Problems (from 12/10/20 to present)   ? ? Problem Noted Resolved  ? Headache 06/12/2021 by Ellwood Sayers, CNM No  ? Supervision of normal pregnancy 12/10/2020 by Tresea Mall, CNM No  ? Overview Addendum 04/29/2021  9:34 AM by Ellwood Sayers, CNM  ?   ?Nursing Staff Provider  ?Office Location  [facility] Dating  LMP, [place]  ?Language  Ambulance person [place]  [facility] nml  ?Flu Vaccine  Declines Genetic Screen  NIPS: nml XY  ?TDaP vaccine    Hgb A1C or  ?GTT Early : NA ?Third trimester : 53  ?Covid    LAB RESULTS   ?Rhogam  n/a Blood Type O/Positive/-- (09/30 1113)   ?Feeding Plan Breast Antibody Negative (09/30 1113)  ?Contraception POP Rubella 4.17 (09/30 1113)  ?Circumcision Yes? RPR Non Reactive (09/30 1113)   ?Pediatrician  [place] [facility] HBsAg Negative (09/30 1113)   ?Support Person Husband Matt HIV Non Reactive (09/30 1113)  ?Prenatal Classes  Varicella Imm  ?  GBS  (For PCN allergy, check sensitivities)   ?BTL Consent no    ?VBAC Consent n/a Pap 2021 negative   ?     ?Pelvis Tested no    ? ? ?  ?  ? ?  ?  ? ?Term labor symptoms and general obstetric precautions including but not limited to vaginal bleeding, contractions, leaking of fluid and fetal movement were reviewed in detail with the patient. ?Please refer to After  Visit Summary for other counseling recommendations.  ? ?IOL on Wednesday at 0500 ? ?Carie Caddy, CNM  ?Domingo Pulse, [facility]  ?06/24/21  ?3:57 PM  ? ? ?

## 2021-06-25 NOTE — H&P (Addendum)
OB History & Physical  ? ?History of Present Illness:  ?Chief Complaint:  ? ?HPI:  ?Sherry Lutz is a 26 y.o. G1P0 female at [redacted]w[redacted]d dated by 6wkUS.  She presents to L&D for induction of labor. She had early and regular prenatal care. Has a history of Asthma.  At 35 weeks at a ROB she  reported having a headache for 3 days that barely responded to Tylenol, labs were collected, CMP was WNL once UPC resulted on 3/22 it was noted to be 362, on 3/22 she reported having a severe HA, she was evaluated at [facility], CMP and UPC were WNL, she was given multiple medications to help the headache reduce, she was evaluated by Neuro, who recommend an MRI, Sherry Lutz preferred to to try medications and consider MRI if HA persisted. Dr Tiburcio Pea consulted, discussed potential need for IOL at 37 week if HA persists.  At her ROB on 3/27 Sherry Lutz reported she has had a HA since discharge that does not always respond to Tylenol. She agreed a MRI.  CMP and UPC were collected. UPC was 311.  MRI had no significant findings. On 4/3 Sherry Lutz reported continuing to have the HA despite daily use of Magnesium, Co enzyme q 10 and Riboflavin.  Given the ongoing HA IOL was offered and accepted. Her blood pressures have been WNL throughout her entire pregnancy.  ? ?+FM, no CTX, no LOF, no VB ? ?Pregnancy Issues: ?1. Persistent headache with elevated UPC  ?2. HX Asthma  ? ?Maternal Medical History:  ? ?Past Medical History:  ?Diagnosis Date  ? Asthma   ? RSD (reflex sympathetic dystrophy)   ? foot  ? ? ?Past Surgical History:  ?Procedure Laterality Date  ? INTERCOSTAL NERVE BLOCK    ? ? ?Allergies  ?Allergen Reactions  ? Pregabalin   ?  Other reaction(s): Hallucinations  ? Red Dye Rash  ? ? ?Prior to Admission medications   ?Medication Sig Start Date End Date Taking? Authorizing Provider  ?acetaminophen (TYLENOL) 650 MG CR tablet Take 1,000 mg by mouth every 8 (eight) hours as needed for pain.    [provider]  ?Cholecalciferol 50 MCG (2000 UT) CAPS  Take by mouth.    [provider]  ?doxylamine, Sleep, (UNISOM) 25 MG tablet Take 12.5 mg by mouth at bedtime as needed (for nausea).    [provider]  ?ferrous sulfate 325 (65 FE) MG EC tablet Take 325 mg by mouth 3 (three) times daily with meals.    [provider]  ?fluticasone (FLOVENT HFA) 110 MCG/ACT inhaler Inhale into the lungs. 02/25/19 02/25/20  [provider]  ?montelukast (SINGULAIR) 10 MG tablet Take 10 mg by mouth daily. 11/28/18   [provider]  ?Prenatal w/o A Vit-Fe Fum-FA (PRENATAL VITAMIN W/FE, FA) 29-1 MG CHEW     [provider]  ? ? ? ?Prenatal care site: [facility]  ? ?Social History: She  reports that she has never smoked. She has never used smokeless tobacco. She reports that she does not drink alcohol and does not use drugs. ? ?Family History: family history includes Healthy in her father and mother.  ? ?Review of Systems: A full review of systems was performed and negative except as noted in the HPI.   ? ? ?Physical Exam:  ?Vital Signs: BP 120/60   Wt 159 lb (72.1 kg)   LMP 09/27/2020   BMI 29.08 kg/m?  ?General: no acute distress.  ?HEENT: normocephalic, atraumatic ?Heart: regular rate &  rhythm.  No murmurs/rubs/gallops ?Lungs: clear to auscultation bilaterally, normal respiratory effort ?Abdomen: soft, gravid, non-tender;  EFW: 6,5lbs  ?Pelvic:  ? External: Normal external female genitalia ? Cervix: Dilation: 1 / Effacement (%): 50 / Station: -1  ?  ?Extremities: non-tender, symmetric, trace edema bilaterally.  DTRs: +2  ?Neurologic: Alert & oriented x 3.   ? ?Results for orders placed or performed in visit on 06/24/21 (from the past 24 hour(s))  ?POC Urinalysis Dipstick OB     Status: None  ? Collection Time: 06/24/21  3:21 PM  ?Result Value Ref Range  ? Color, UA    ? Clarity, UA    ? Glucose, UA Negative Negative  ? Bilirubin, UA    ? Ketones, UA    ? Spec Grav, UA    ? Blood, UA    ? pH, UA    ? POC,PROTEIN,UA Negative  Negative, Trace, Small (1+), Moderate (2+), Large (3+), 4+  ? Urobilinogen, UA    ? Nitrite, UA    ? Leukocytes, UA    ? Appearance    ? Odor    ? ? ?Pertinent Results:  ?Prenatal Labs: ?Blood type/Rh O positive negative   ?Antibody screen neg  ?Rubella Immune  ?Varicella Immune  ?RPR NR  ?HBsAg Neg  ?HIV NR  ?GC neg  ?Chlamydia neg  ?Genetic screening negative  ?1 hour GTT   ?3 hour GTT   ?GBS Negative   ? ? ?SVE:  Dilation: 1 / Effacement (%): 50 / Station: -1  ?  ?Cephalic by leopolds ? ?MR BRAIN WO CONTRAST ? ?Result Date: 06/21/2021 ?CLINICAL DATA:  headcahe, see neuro consult on 3/22; headaches, see neuro consult 3/22 EXAM: MRI HEAD WITHOUT CONTRAST MRV HEAD WITHOUT CONTRAST TECHNIQUE: Multiplanar, multi-echo pulse sequences of the brain and surrounding structures were acquired without intravenous contrast. Angiographic images of the intracranial venous structures were acquired using MRV technique without intravenous contrast. COMPARISON:  No pertinent prior exam. FINDINGS: MRI HEAD WITHOUT CONTRAST Brain: No acute infarction, hemorrhage, hydrocephalus, extra-axial collection or mass lesion. Enlarged and upwardly convex pituitary gland. Vascular: Major arterial flow voids are maintained at the skull base. Skull and upper cervical spine: Normal marrow signal. Sinuses/Orbits: Clear sinuses.  No acute orbital findings. Other: No mastoid effusions. MR VENOGRAM WITHOUT CONTRAST No evidence of dural venous sinus thrombosis. The superior sagittal sinus, sigmoid, transverse and straight sinuses are patent. Visualized deep cerebral veins are patent. Jugular bulbs are patent. IMPRESSION: 1. No evidence of acute intracranial abnormality. 2. No evidence of dural venous sinus thrombosis. 3. Enlarged and upwardly convex pituitary gland, likely physiologic hypertrophy given the patient is pregnant. A follow-up MRI after pregnancy could ensure resolution if clinically warranted. Electronically Signed   By: Feliberto HartsFrederick S Jones  M.D.   On: 06/21/2021 14:34  ? ?MR MRV HEAD WO CM ? ?Result Date: 06/21/2021 ?CLINICAL DATA:  headcahe, see neuro consult on 3/22; headaches, see neuro consult 3/22 EXAM: MRI HEAD WITHOUT CONTRAST MRV HEAD WITHOUT CONTRAST TECHNIQUE: Multiplanar, multi-echo pulse sequences of the brain and surrounding structures were acquired without intravenous contrast. Angiographic images of the intracranial venous structures were acquired using MRV technique without intravenous contrast. COMPARISON:  No pertinent prior exam. FINDINGS: MRI HEAD WITHOUT CONTRAST Brain: No acute infarction, hemorrhage, hydrocephalus, extra-axial collection or mass lesion. Enlarged and upwardly convex pituitary gland. Vascular: Major arterial flow voids are maintained at the skull base. Skull and upper cervical spine: Normal marrow signal. Sinuses/Orbits: Clear sinuses.  No acute orbital findings. Other:  No mastoid effusions. MR VENOGRAM WITHOUT CONTRAST No evidence of dural venous sinus thrombosis. The superior sagittal sinus, sigmoid, transverse and straight sinuses are patent. Visualized deep cerebral veins are patent. Jugular bulbs are patent. IMPRESSION: 1. No evidence of acute intracranial abnormality. 2. No evidence of dural venous sinus thrombosis. 3. Enlarged and upwardly convex pituitary gland, likely physiologic hypertrophy given the patient is pregnant. A follow-up MRI after pregnancy could ensure resolution if clinically warranted. Electronically Signed   By: Feliberto Harts M.D.   On: 06/21/2021 14:34   ? ?Assessment:  ?Sherry Lutz is a 26 y.o. G1P0 female at [redacted]w[redacted]d with induction of labor.  ? ?Plan:  ?Admit to Labor & Delivery ?CBC, T&S, Clrs, IVF, FB and cytotec  ?GBS  negative  ?Consents obtained. ?Continuous efm/toco ?Pain management: aware of all options, will ask if desired  ? ?----- ?Carie Caddy, CNM  ?[facility] ?[place]   ?

## 2021-06-25 NOTE — H&P (Deleted)
  The note originally documented on this encounter has been moved the the encounter in which it belongs.  

## 2021-06-26 ENCOUNTER — Inpatient Hospital Stay
Admission: RE | Admit: 2021-06-26 | Discharge: 2021-06-28 | DRG: 807 | Disposition: A | Discharge status: Home / Self Care | Payer: 59 | Source: Ambulatory Visit | Attending: Obstetrics & Gynecology | Admitting: Obstetrics & Gynecology

## 2021-06-26 ENCOUNTER — Encounter: Payer: Self-pay | Admitting: Obstetrics and Gynecology

## 2021-06-26 ENCOUNTER — Other Ambulatory Visit: Payer: Self-pay

## 2021-06-26 DIAGNOSIS — O9952 Diseases of the respiratory system complicating childbirth: Secondary | ICD-10-CM | POA: Diagnosis present

## 2021-06-26 DIAGNOSIS — Z3403 Encounter for supervision of normal first pregnancy, third trimester: Principal | ICD-10-CM

## 2021-06-26 DIAGNOSIS — Z3A37 37 weeks gestation of pregnancy: Secondary | ICD-10-CM

## 2021-06-26 DIAGNOSIS — O26893 Other specified pregnancy related conditions, third trimester: Principal | ICD-10-CM | POA: Diagnosis present

## 2021-06-26 DIAGNOSIS — J45909 Unspecified asthma, uncomplicated: Secondary | ICD-10-CM | POA: Diagnosis present

## 2021-06-26 DIAGNOSIS — Z3490 Encounter for supervision of normal pregnancy, unspecified, unspecified trimester: Secondary | ICD-10-CM | POA: Diagnosis present

## 2021-06-26 DIAGNOSIS — R519 Headache, unspecified: Secondary | ICD-10-CM | POA: Diagnosis present

## 2021-06-26 LAB — CBC
HCT: 34.7 % — ABNORMAL LOW (ref 36.0–46.0)
Hemoglobin: 11.7 g/dL — ABNORMAL LOW (ref 12.0–15.0)
MCH: 30.5 pg (ref 26.0–34.0)
MCHC: 33.7 g/dL (ref 30.0–36.0)
MCV: 90.4 fL (ref 80.0–100.0)
Platelets: 185 10*3/uL (ref 150–400)
RBC: 3.84 MIL/uL — ABNORMAL LOW (ref 3.87–5.11)
RDW: 12.5 % (ref 11.5–15.5)
WBC: 8.6 10*3/uL (ref 4.0–10.5)
nRBC: 0 % (ref 0.0–0.2)

## 2021-06-26 LAB — COMPREHENSIVE METABOLIC PANEL
ALT: 13 U/L (ref 0–44)
AST: 25 U/L (ref 15–41)
Albumin: 3.2 g/dL — ABNORMAL LOW (ref 3.5–5.0)
Alkaline Phosphatase: 180 U/L — ABNORMAL HIGH (ref 38–126)
Anion gap: 9 (ref 5–15)
BUN: <5 mg/dL — ABNORMAL LOW (ref 6–20)
CO2: 20 mmol/L — ABNORMAL LOW (ref 22–32)
Calcium: 8.8 mg/dL — ABNORMAL LOW (ref 8.9–10.3)
Chloride: 108 mmol/L (ref 98–111)
Creatinine, Ser: 0.49 mg/dL (ref 0.44–1.00)
GFR, Estimated: >60 mL/min (ref >60)
Glucose, Bld: 102 mg/dL — ABNORMAL HIGH (ref 70–99)
Potassium: 3.2 mmol/L — ABNORMAL LOW (ref 3.5–5.1)
Sodium: 137 mmol/L (ref 135–145)
Total Bilirubin: 0.5 mg/dL (ref 0.3–1.2)
Total Protein: 6.5 g/dL (ref 6.5–8.1)

## 2021-06-26 LAB — LACTATE DEHYDROGENASE: LDH: 125 U/L (ref 98–192)

## 2021-06-26 LAB — ABO/RH: ABO/RH(D): O POS

## 2021-06-26 LAB — TYPE AND SCREEN
ABO/RH(D): O POS
Antibody Screen: NEGATIVE

## 2021-06-26 LAB — URIC ACID: Uric Acid, Serum: 5.9 mg/dL (ref 2.5–7.1)

## 2021-06-26 LAB — PROTEIN / CREATININE RATIO, URINE
Creatinine, Urine: 29 mg/dL
Protein Creatinine Ratio: 0.24 mg/mg{Cre} — ABNORMAL HIGH (ref 0.00–0.15)
Total Protein, Urine: 7 mg/dL

## 2021-06-26 MED ORDER — AMMONIA AROMATIC IN INHA
RESPIRATORY_TRACT | Status: AC
  Filled 2021-06-26: qty 10

## 2021-06-26 MED ORDER — LACTATED RINGERS IV SOLN
500.0000 mL | INTRAVENOUS | Status: DC | PRN
  Administered 2021-06-27: 500 mL via INTRAVENOUS

## 2021-06-26 MED ORDER — OXYTOCIN-SODIUM CHLORIDE 30-0.9 UT/500ML-% IV SOLN: 2.5000 [IU]/h | INTRAVENOUS | Status: DC

## 2021-06-26 MED ORDER — LIDOCAINE HCL (PF) 1 % IJ SOLN
INTRAMUSCULAR | Status: DC
Start: 2021-06-26 — End: 2021-06-27
  Filled 2021-06-26: qty 30

## 2021-06-26 MED ORDER — LIDOCAINE HCL (PF) 1 % IJ SOLN
30.0000 mL | INTRAMUSCULAR | Status: DC | PRN
  Filled 2021-06-26: qty 30

## 2021-06-26 MED ORDER — ONDANSETRON HCL 4 MG/2ML IJ SOLN: 4.0000 mg | Freq: Four times a day (QID) | INTRAMUSCULAR | Status: DC | PRN

## 2021-06-26 MED ORDER — OXYTOCIN-SODIUM CHLORIDE 30-0.9 UT/500ML-% IV SOLN
1.0000 m[IU]/min | INTRAVENOUS | Status: DC
  Administered 2021-06-26: 2 m[IU]/min via INTRAVENOUS
  Filled 2021-06-26: qty 1000

## 2021-06-26 MED ORDER — MISOPROSTOL 25 MCG QUARTER TABLET
25.0000 ug | ORAL_TABLET | ORAL | Status: DC | PRN
  Administered 2021-06-26 (×2): 25 ug via VAGINAL
  Filled 2021-06-26 (×2): qty 1

## 2021-06-26 MED ORDER — LACTATED RINGERS IV SOLN
INTRAVENOUS | Status: DC
Start: 2021-06-26 — End: 2021-06-28

## 2021-06-26 MED ORDER — OXYTOCIN 10 UNIT/ML IJ SOLN
INTRAMUSCULAR | Status: AC
  Filled 2021-06-26: qty 2

## 2021-06-26 MED ORDER — TERBUTALINE SULFATE 1 MG/ML IJ SOLN: 0.2500 mg | Freq: Once | INTRAMUSCULAR | Status: DC | PRN

## 2021-06-26 MED ORDER — MISOPROSTOL 200 MCG PO TABS
ORAL_TABLET | ORAL | Status: AC
  Filled 2021-06-26: qty 4

## 2021-06-26 MED ORDER — SOD CITRATE-CITRIC ACID 500-334 MG/5ML PO SOLN: 30.0000 mL | ORAL | Status: DC | PRN

## 2021-06-26 MED ORDER — OXYTOCIN BOLUS FROM INFUSION
333.0000 mL | Freq: Once | INTRAVENOUS | Status: AC
  Administered 2021-06-27: 333 mL via INTRAVENOUS

## 2021-06-26 MED ORDER — BUTORPHANOL TARTRATE 1 MG/ML IJ SOLN: 1.0000 mg | INTRAMUSCULAR | Status: DC | PRN

## 2021-06-26 NOTE — Progress Notes (Signed)
? ?  Subjective:  ?Feeling mild contractions.   ? ?Objective:  ? ?Vitals: Blood pressure 123/70, pulse 87, temperature 98.4 ?F (36.9 ?C), temperature source Oral, resp. rate 16, height 5\' 2"  (1.575 m), weight 72.6 kg, last menstrual period 09/27/2020. ?General: NAD ?Abdomen:non tender  ?Cervical Exam: FB present  ?  ? ?FHT: baseline 135, moderate variability, pos accel, neg decel  ?Toco:q2-4, mild, soft resting tone  ? ?Results for orders placed or performed during the hospital encounter of 06/26/21 (from the past 24 hour(s))  ?CBC     Status: Abnormal  ? Collection Time: 06/26/21 10:01 AM  ?Result Value Ref Range  ? WBC 8.6 4.0 - 10.5 K/uL  ? RBC 3.84 (L) 3.87 - 5.11 MIL/uL  ? Hemoglobin 11.7 (L) 12.0 - 15.0 g/dL  ? HCT 34.7 (L) 36.0 - 46.0 %  ? MCV 90.4 80.0 - 100.0 fL  ? MCH 30.5 26.0 - 34.0 pg  ? MCHC 33.7 30.0 - 36.0 g/dL  ? RDW 12.5 11.5 - 15.5 %  ? Platelets 185 150 - 400 K/uL  ? nRBC 0.0 0.0 - 0.2 %  ?Type and screen     Status: None  ? Collection Time: 06/26/21 10:01 AM  ?Result Value Ref Range  ? ABO/RH(D) O POS   ? Antibody Screen NEG   ? Sample Expiration    ?  06/29/2021,2359 ?Performed at [facility], [address] [place], [place] ?  ?Comprehensive metabolic panel     Status: Abnormal  ? Collection Time: 06/26/21 10:01 AM  ?Result Value Ref Range  ? Sodium 137 135 - 145 mmol/L  ? Potassium 3.2 (L) 3.5 - 5.1 mmol/L  ? Chloride 108 98 - 111 mmol/L  ? CO2 20 (L) 22 - 32 mmol/L  ? Glucose, Bld 102 (H) 70 - 99 mg/dL  ? BUN <5 (L) 6 - 20 mg/dL  ? Creatinine, Ser 0.49 0.44 - 1.00 mg/dL  ? Calcium 8.8 (L) 8.9 - 10.3 mg/dL  ? Total Protein 6.5 6.5 - 8.1 g/dL  ? Albumin 3.2 (L) 3.5 - 5.0 g/dL  ? AST 25 15 - 41 U/L  ? ALT 13 0 - 44 U/L  ? Alkaline Phosphatase 180 (H) 38 - 126 U/L  ? Total Bilirubin 0.5 0.3 - 1.2 mg/dL  ? GFR, Estimated >60 >60 mL/min  ? Anion gap 9 5 - 15  ?Uric acid     Status: None  ? Collection Time: 06/26/21 10:01 AM  ?Result Value Ref Range  ? Uric Acid, Serum 5.9 2.5  - 7.1 mg/dL  ?Lactate dehydrogenase     Status: None  ? Collection Time: 06/26/21 10:01 AM  ?Result Value Ref Range  ? LDH 125 98 - 192 U/L  ?ABO/Rh     Status: None  ? Collection Time: 06/26/21 11:45 AM  ?Result Value Ref Range  ? ABO/RH(D)    ?  O POS ?Performed at [facility], [address], [place], [place] ?  ? ? ?Assessment:  ? 26 y.o. G1P0 [redacted]w[redacted]d admitted for induction of labor ? ?Plan:  ? ?1) Labor -FB placed at 1040, 2nd dose Cytotec placed at 1440  ? ?2) Fetus - category 1 tracing ? ?3) ID: GBS neg, Membranes intact ? ?4) Pain management: aware of all options, will ask if desired  ?  ? ?[redacted]w[redacted]d, CNM  ?Carie Caddy, [facility]  ?@TODAY @  ?2:45 PM  ?  ?

## 2021-06-26 NOTE — Progress Notes (Signed)
? ?Subjective:  ?Contractions feeling stronger.  Cervix unchanged from last exam, reviewed options Pitocin versus AROM or waiting a little more, pt desires to forward with induction.  ?Objective:  ? ?Vitals: Blood pressure 119/66, pulse 85, temperature 98.6 ?F (37 ?C), temperature source Oral, resp. rate 16, height 5\' 2"  (1.575 m), weight 72.6 kg, last menstrual period 09/27/2020. ?General: NAD ?Abdomen:non tender ?Cervical Exam:  ?Dilation: 5 ?Effacement (%): 50 ?Station: -1 ?Exam by:: Lynnita Somma CNM ? ?FHT: baseline 125, moderate variability, pos accel, neg decel  ?Toco:q2-4, mild-mod, soft resting tone  ? ?Results for orders placed or performed during the hospital encounter of 06/26/21 (from the past 24 hour(s))  ?CBC     Status: Abnormal  ? Collection Time: 06/26/21 10:01 AM  ?Result Value Ref Range  ? WBC 8.6 4.0 - 10.5 K/uL  ? RBC 3.84 (L) 3.87 - 5.11 MIL/uL  ? Hemoglobin 11.7 (L) 12.0 - 15.0 g/dL  ? HCT 34.7 (L) 36.0 - 46.0 %  ? MCV 90.4 80.0 - 100.0 fL  ? MCH 30.5 26.0 - 34.0 pg  ? MCHC 33.7 30.0 - 36.0 g/dL  ? RDW 12.5 11.5 - 15.5 %  ? Platelets 185 150 - 400 K/uL  ? nRBC 0.0 0.0 - 0.2 %  ?Type and screen     Status: None  ? Collection Time: 06/26/21 10:01 AM  ?Result Value Ref Range  ? ABO/RH(D) O POS   ? Antibody Screen NEG   ? Sample Expiration    ?  06/29/2021,2359 ?Performed at [facility], [address], [place], [place] ?  ?Comprehensive metabolic panel     Status: Abnormal  ? Collection Time: 06/26/21 10:01 AM  ?Result Value Ref Range  ? Sodium 137 135 - 145 mmol/L  ? Potassium 3.2 (L) 3.5 - 5.1 mmol/L  ? Chloride 108 98 - 111 mmol/L  ? CO2 20 (L) 22 - 32 mmol/L  ? Glucose, Bld 102 (H) 70 - 99 mg/dL  ? BUN <5 (L) 6 - 20 mg/dL  ? Creatinine, Ser 0.49 0.44 - 1.00 mg/dL  ? Calcium 8.8 (L) 8.9 - 10.3 mg/dL  ? Total Protein 6.5 6.5 - 8.1 g/dL  ? Albumin 3.2 (L) 3.5 - 5.0 g/dL  ? AST 25 15 - 41 U/L  ? ALT 13 0 - 44 U/L  ? Alkaline Phosphatase 180 (H) 38 - 126 U/L  ? Total Bilirubin 0.5  0.3 - 1.2 mg/dL  ? GFR, Estimated >60 >60 mL/min  ? Anion gap 9 5 - 15  ?Uric acid     Status: None  ? Collection Time: 06/26/21 10:01 AM  ?Result Value Ref Range  ? Uric Acid, Serum 5.9 2.5 - 7.1 mg/dL  ?Lactate dehydrogenase     Status: None  ? Collection Time: 06/26/21 10:01 AM  ?Result Value Ref Range  ? LDH 125 98 - 192 U/L  ?ABO/Rh     Status: None  ? Collection Time: 06/26/21 11:45 AM  ?Result Value Ref Range  ? ABO/RH(D)    ?  O POS ?Performed at [facility], [address] [place], [place] ?  ?Protein / creatinine ratio, urine     Status: Abnormal  ? Collection Time: 06/26/21 12:49 PM  ?Result Value Ref Range  ? Creatinine, Urine 29 mg/dL  ? Total Protein, Urine 7 mg/dL  ? Protein Creatinine Ratio 0.24 (H) 0.00 - 0.15 mg/mg[Cre]  ? ? ?Assessment:  ? 26 y.o. G1P0 [redacted]w[redacted]d admitted for induction  of labor  ? ?Plan:  ? ? 1) Labor -FB placed at 1040, 2nd dose Cytotec placed at 1440, FB out, Pitocin started at 2118,  AROM when appropriate.  ?  ?2) Fetus - category 1 tracing ?  ?3) ID: GBS neg, Membranes intact ?  ?4) Pain management: aware of all options, will ask if desired  ?  ?Carie Caddy, CNM  ?Domingo Pulse, [facility]  ?@TODAY @  ?9:41 PM  ? ?

## 2021-06-26 NOTE — Progress Notes (Signed)
? ?Subjective:  ?Feeling stronger contractions, but coping. Has had mild headache since admission, declines medication.  ? ?Objective:  ? ?Vitals: Blood pressure 129/85, pulse 79, temperature 98.4 ?F (36.9 ?C), temperature source Oral, resp. rate 16, height 5\' 2"  (1.575 m), weight 72.6 kg, last menstrual period 09/27/2020. ?General: NAD ?Abdomen:non tender  ?Cervical Exam:  ?Dilation: 5 ?Effacement (%): 50 ?Station: -1 ?Exam by:: [email or web address], CNM ? ?FHT: baseline 130, moderate variability, pos accel, neg decel  ?Toco:q 1.5-4, mild, soft resting tone  ? ?Results for orders placed or performed during the hospital encounter of 06/26/21 (from the past 24 hour(s))  ?CBC     Status: Abnormal  ? Collection Time: 06/26/21 10:01 AM  ?Result Value Ref Range  ? WBC 8.6 4.0 - 10.5 K/uL  ? RBC 3.84 (L) 3.87 - 5.11 MIL/uL  ? Hemoglobin 11.7 (L) 12.0 - 15.0 g/dL  ? HCT 34.7 (L) 36.0 - 46.0 %  ? MCV 90.4 80.0 - 100.0 fL  ? MCH 30.5 26.0 - 34.0 pg  ? MCHC 33.7 30.0 - 36.0 g/dL  ? RDW 12.5 11.5 - 15.5 %  ? Platelets 185 150 - 400 K/uL  ? nRBC 0.0 0.0 - 0.2 %  ?Type and screen     Status: None  ? Collection Time: 06/26/21 10:01 AM  ?Result Value Ref Range  ? ABO/RH(D) O POS   ? Antibody Screen NEG   ? Sample Expiration    ?  06/29/2021,2359 ?Performed at [facility], [address], [place], [place] ?  ?Comprehensive metabolic panel     Status: Abnormal  ? Collection Time: 06/26/21 10:01 AM  ?Result Value Ref Range  ? Sodium 137 135 - 145 mmol/L  ? Potassium 3.2 (L) 3.5 - 5.1 mmol/L  ? Chloride 108 98 - 111 mmol/L  ? CO2 20 (L) 22 - 32 mmol/L  ? Glucose, Bld 102 (H) 70 - 99 mg/dL  ? BUN <5 (L) 6 - 20 mg/dL  ? Creatinine, Ser 0.49 0.44 - 1.00 mg/dL  ? Calcium 8.8 (L) 8.9 - 10.3 mg/dL  ? Total Protein 6.5 6.5 - 8.1 g/dL  ? Albumin 3.2 (L) 3.5 - 5.0 g/dL  ? AST 25 15 - 41 U/L  ? ALT 13 0 - 44 U/L  ? Alkaline Phosphatase 180 (H) 38 - 126 U/L  ? Total Bilirubin 0.5 0.3 - 1.2 mg/dL  ? GFR, Estimated >60 >60 mL/min  ?  Anion gap 9 5 - 15  ?Uric acid     Status: None  ? Collection Time: 06/26/21 10:01 AM  ?Result Value Ref Range  ? Uric Acid, Serum 5.9 2.5 - 7.1 mg/dL  ?Lactate dehydrogenase     Status: None  ? Collection Time: 06/26/21 10:01 AM  ?Result Value Ref Range  ? LDH 125 98 - 192 U/L  ?ABO/Rh     Status: None  ? Collection Time: 06/26/21 11:45 AM  ?Result Value Ref Range  ? ABO/RH(D)    ?  O POS ?Performed at [facility], [address], [place], [place] ?  ?Protein / creatinine ratio, urine     Status: Abnormal  ? Collection Time: 06/26/21 12:49 PM  ?Result Value Ref Range  ? Creatinine, Urine 29 mg/dL  ? Total Protein, Urine 7 mg/dL  ? Protein Creatinine Ratio 0.24 (H) 0.00 - 0.15 mg/mg[Cre]  ? ? ?Assessment:  ? 26 y.o. G1P0 [redacted]w[redacted]d admitted for induction of labor.  ? ?Plan:  ?1) Labor -  FB placed at 1040, 2nd dose Cytotec placed at 1440, FB out, will start Pitocin once Cassie has eaten dinner.  AROM when appropriate.  ?  ?2) Fetus - category 1 tracing ?  ?3) ID: GBS neg, Membranes intact ?  ?4) Pain management: aware of all options, will ask if desired  ? ?Carie Caddy, CNM  ?Domingo Pulse, [facility]  ?@TODAY @  ?6:39 PM  ? ?  ? ?  ?

## 2021-06-26 NOTE — H&P (Signed)
History and Physical Interval Note: ? ?06/26/2021 ?10:58 AM ? ?AMANPREET DELMONT  has presented today for INDUCTION OF LABOR (scheduled for 4//523),  with the diagnosis of  persistent headache . The various methods of treatment have been discussed with the patient and family. After consideration of risks, benefits and other options for treatment, the patient has consented to  Labor induction .  The patient's history has been reviewed, patient examined, no change in status, and is stable for induction as planned.  See H&P. I have reviewed the patient's chart and labs.  Questions were answered to the patient's satisfaction.   ? ?VE 1/50/-2 ?EFW 6.5lbs  ?EFM baseline 130, moderate variability, pos accel,neg decel ?TOCO: q1-4, mild, soft resting tone ? ?Plan: ?Induction: FB and Cytotec placed at 1040, AROM when appropriate ?Category 1 tracing  ?GBS negative ?Pain management: aware of all options, will ask if desired  ?  ? ?Carie Caddy, CNM  ?Domingo Pulse, [facility]  ?@TODAY @  ?11:03 AM  ? ? ? ?

## 2021-06-27 ENCOUNTER — Encounter: Payer: Self-pay | Admitting: Obstetrics and Gynecology

## 2021-06-27 ENCOUNTER — Inpatient Hospital Stay: Payer: 59 | Admitting: Anesthesiology

## 2021-06-27 DIAGNOSIS — Z3A37 37 weeks gestation of pregnancy: Secondary | ICD-10-CM

## 2021-06-27 DIAGNOSIS — R519 Headache, unspecified: Secondary | ICD-10-CM

## 2021-06-27 DIAGNOSIS — O26893 Other specified pregnancy related conditions, third trimester: Secondary | ICD-10-CM

## 2021-06-27 LAB — RPR: RPR Ser Ql: NONREACTIVE

## 2021-06-27 MED ORDER — BENZOCAINE-MENTHOL 20-0.5 % EX AERO
1.0000 "application " | INHALATION_SPRAY | CUTANEOUS | Status: DC | PRN
  Filled 2021-06-27: qty 56

## 2021-06-27 MED ORDER — SODIUM CHLORIDE 0.9 % IV SOLN
INTRAVENOUS | Status: DC | PRN
  Administered 2021-06-27 (×2): 5 mL via EPIDURAL

## 2021-06-27 MED ORDER — LACTATED RINGERS IV SOLN: 500.0000 mL | Freq: Once | INTRAVENOUS | Status: DC

## 2021-06-27 MED ORDER — LIDOCAINE HCL (PF) 1 % IJ SOLN
INTRAMUSCULAR | Status: DC | PRN
  Administered 2021-06-27: 2 mL via SUBCUTANEOUS

## 2021-06-27 MED ORDER — DIPHENHYDRAMINE HCL 25 MG PO CAPS: 25.0000 mg | ORAL_CAPSULE | Freq: Four times a day (QID) | ORAL | Status: DC | PRN

## 2021-06-27 MED ORDER — SIMETHICONE 80 MG PO CHEW: 80.0000 mg | CHEWABLE_TABLET | ORAL | Status: DC | PRN

## 2021-06-27 MED ORDER — ACETAMINOPHEN 500 MG PO TABS
1000.0000 mg | ORAL_TABLET | Freq: Four times a day (QID) | ORAL | Status: DC
  Administered 2021-06-27 – 2021-06-28 (×5): 1000 mg via ORAL
  Filled 2021-06-27 (×5): qty 2

## 2021-06-27 MED ORDER — DIPHENHYDRAMINE HCL 50 MG/ML IJ SOLN: 12.5000 mg | INTRAMUSCULAR | Status: DC | PRN

## 2021-06-27 MED ORDER — EPHEDRINE 5 MG/ML INJ
10.0000 mg | INTRAVENOUS | Status: DC | PRN
  Filled 2021-06-27: qty 2

## 2021-06-27 MED ORDER — COCONUT OIL OIL
1.0000 "application " | TOPICAL_OIL | Status: DC | PRN
  Administered 2021-06-27: 1 via TOPICAL
  Filled 2021-06-27: qty 120

## 2021-06-27 MED ORDER — IBUPROFEN 600 MG PO TABS
600.0000 mg | ORAL_TABLET | Freq: Four times a day (QID) | ORAL | Status: DC
  Administered 2021-06-27 – 2021-06-28 (×5): 600 mg via ORAL
  Filled 2021-06-27 (×5): qty 1

## 2021-06-27 MED ORDER — FENTANYL-BUPIVACAINE-NACL 0.5-0.125-0.9 MG/250ML-% EP SOLN
EPIDURAL | Status: DC | PRN
  Administered 2021-06-27: 12 mL/h via EPIDURAL

## 2021-06-27 MED ORDER — LIDOCAINE-EPINEPHRINE (PF) 1.5 %-1:200000 IJ SOLN
INTRAMUSCULAR | Status: DC | PRN
  Administered 2021-06-27: 2 mL via EPIDURAL
  Administered 2021-06-27: 3 mL via EPIDURAL

## 2021-06-27 MED ORDER — PHENYLEPHRINE 40 MCG/ML (10ML) SYRINGE FOR IV PUSH (FOR BLOOD PRESSURE SUPPORT)
80.0000 ug | PREFILLED_SYRINGE | INTRAVENOUS | Status: DC | PRN
Start: 2021-06-27 — End: 2021-06-28
  Filled 2021-06-27: qty 10

## 2021-06-27 MED ORDER — ONDANSETRON HCL 4 MG PO TABS: 4.0000 mg | ORAL_TABLET | ORAL | Status: DC | PRN

## 2021-06-27 MED ORDER — FENTANYL-BUPIVACAINE-NACL 0.5-0.125-0.9 MG/250ML-% EP SOLN: 12.0000 mL/h | EPIDURAL | Status: DC | PRN

## 2021-06-27 MED ORDER — FENTANYL-BUPIVACAINE-NACL 0.5-0.125-0.9 MG/250ML-% EP SOLN
EPIDURAL | Status: AC
  Filled 2021-06-27: qty 250

## 2021-06-27 MED ORDER — DIBUCAINE (PERIANAL) 1 % EX OINT
1.0000 "application " | TOPICAL_OINTMENT | CUTANEOUS | Status: DC | PRN
  Filled 2021-06-27 (×3): qty 28

## 2021-06-27 MED ORDER — WITCH HAZEL-GLYCERIN EX PADS
1.0000 "application " | MEDICATED_PAD | CUTANEOUS | Status: DC | PRN
  Filled 2021-06-27 (×2): qty 100

## 2021-06-27 MED ORDER — ONDANSETRON HCL 4 MG/2ML IJ SOLN
4.0000 mg | INTRAMUSCULAR | Status: DC | PRN
  Administered 2021-06-27: 4 mg via INTRAVENOUS
  Filled 2021-06-27: qty 2

## 2021-06-27 MED ORDER — PHENYLEPHRINE 40 MCG/ML (10ML) SYRINGE FOR IV PUSH (FOR BLOOD PRESSURE SUPPORT)
80.0000 ug | PREFILLED_SYRINGE | INTRAVENOUS | Status: DC | PRN
  Filled 2021-06-27: qty 10

## 2021-06-27 NOTE — Discharge Summary (Signed)
Obstetrical Discharge Summary ? ?Date of Admission: 06/26/2021 ?Date of Discharge: 06/28/2021 ? ?Primary OB: [facility] ? ?Gestational Age at Delivery: [redacted]w[redacted]d  ? ?Antepartum complications: persistent headaches  ?Reason for Admission: induction of labor  ?Date of Delivery: 06/27/21  ?Delivered By: Siri Cole, CNM  ?Delivery Type: spontaneous vaginal delivery ?Intrapartum complications/course: None ?Anesthesia: epidural ?Placenta: Delivered and expressed via active management. Intact: yes. To pathology: no.  ?Laceration: vaginal ?Episiotomy: none ?EBL: ?Baby: Liveborn female, APGARs 9/9, weight 3360 g.  ?  ?Discharge Diagnosis: Delivered.  ? ?Postpartum course: Patient had a routine postpartum course. She is tolerating regular diet, her pain is controlled with PO medication, she is ambulating and voiding without difficulty. She reports breastfeeding is going well- some cluster feeding.  ? ?Discharge Vital Signs: ? Current Vital Signs 24h Vital Sign Ranges  ?T 97.8 ?F (36.6 ?C) Temp  Avg: 98.3 ?F (36.8 ?C)  Min: 97.7 ?F (36.5 ?C)  Max: 99 ?F (37.2 ?C)  ?BP 107/61 BP  Min: 104/62  Max: 123/78  ?HR 71 ? Pulse  Avg: 80.9  Min: 70  Max: 92  ?RR 16 Resp  Avg: 17.7  Min: 16  Max: 18  ?SaO2 100 %  (room air) SpO2  Avg: 99.6 %  Min: 98 %  Max: 100 %  ?    ? 24 Hour I/O Current Shift I/O  ?Time ?Ins ?Outs 04/06 0701 - 04/07 0700 ?In: 2982.8 [I.V.:2982.8] ?Out: 1690 [Urine:700] 04/07 0701 - 04/07 1900 ?In: 240 [P.O.:240] ?Out: -   ? ? ? ?Patient Vitals for the past 6 hrs: ? BP Temp Temp src Pulse Resp SpO2  ?06/28/21 0714 107/61 97.8 ?F (36.6 ?C) Oral 71 16 100 %  ? ? ?Discharge Exam:  ?NAD ?Perineum: 2nd degree repaired ?Abdomen: firm fundus below the umbilicus, NTTP, non distended, +bowel sounds.   ?RRR no MRGs ?CTAB ?Ext: no c/c/e, no evidence of DVT ? ?Recent Labs  ?Lab 06/26/21 ?1001 06/28/21 ?0716  ?WBC 8.6 14.3*  ?HGB 11.7* 8.4*  ?HCT 34.7* 25.8*  ?PLT 185 184  ? ? ?Disposition: Home ? ?Rh Immune globulin given: no ?Rubella  vaccine given: no ?Tdap vaccine given in AP or PP setting: no ?Flu vaccine given in AP or PP setting: no ? ?Contraception: oral progesterone-only contraceptive ? ?Prenatal/Postnatal Panel: O POS ?Performed at [facility], [address] [place], [place] [address] ?Ishmael Holter Immune//Varicella Immune//RPR negative//HIV negative/HepB Surface Ag negative//pap no abnormalities (date: 2021)//plans to breastfeed ? ?Plan:  ?Sherry Lutz was discharged to home in good condition. ?Follow-up appointment with LMD in 2 and 6weeks for a PP  visit ? ?No future appointments. ? ?Discharge Medications: ?Allergies as of 06/28/2021   ? ?   Reactions  ? Pregabalin   ? Other reaction(s): Hallucinations  ? Red Dye Rash  ? ?  ? ?  ?Medication List  ?  ? ?STOP taking these medications   ? ?doxylamine (Sleep) 25 MG tablet ?Commonly known as: UNISOM ?  ? ?  ? ?TAKE these medications   ? ?acetaminophen 650 MG CR tablet ?Commonly known as: TYLENOL ?Take 1,000 mg by mouth every 8 (eight) hours as needed for pain. ?  ?Cholecalciferol 50 MCG (2000 UT) Caps ?Take by mouth. ?  ?ferrous sulfate 325 (65 FE) MG EC tablet ?Take 1 tablet (325 mg total) by mouth 2 (two) times daily with a meal. ?What changed: when to take this ?  ?fluticasone 110 MCG/ACT inhaler ?Commonly known as: FLOVENT HFA ?Inhale into the  lungs. ?  ?montelukast 10 MG tablet ?Commonly known as: SINGULAIR ?Take 10 mg by mouth daily. ?  ?norethindrone 0.35 MG tablet ?Commonly known as: MICRONOR ?Take 1 tablet (0.35 mg total) by mouth daily. ?Start taking on: Jul 28, 2021 ?  ?prenatal vitamin w/FE, FA 29-1 MG Chew ?  ? ?  ? ? ?Parke Poisson, CNM ?[facility] ?[facility] ?06/28/2021, 10:24 AM ? ?

## 2021-06-27 NOTE — Anesthesia Procedure Notes (Signed)
Epidural ?Patient location during procedure: OB ?Start time: 06/27/2021 1:19 AM ?End time: 06/27/2021 1:40 AM ? ?Staffing ?Anesthesiologist: Lenard Simmer, MD ?Performed: anesthesiologist  ? ?Preanesthetic Checklist ?Completed: patient identified, IV checked, site marked, risks and benefits discussed, surgical consent, monitors and equipment checked, pre-op evaluation and timeout performed ? ?Epidural ?Patient position: sitting ?Prep: ChloraPrep ?Patient monitoring: heart rate, continuous pulse ox and blood pressure ?Approach: midline ?Location: L3-L4 ?Injection technique: LOR saline ? ?Needle:  ?Needle type: Tuohy  ?Needle gauge: 17 G ?Needle length: 9 cm and 9 ?Needle insertion depth: 5.5 cm ?Catheter type: closed end flexible ?Catheter size: 19 Gauge ?Catheter at skin depth: 10.5 cm ?Test dose: negative and 1.5% lidocaine with Epi 1:200 K ? ?Assessment ?Sensory level: T10 ?Events: blood not aspirated, injection not painful, no injection resistance, no paresthesia and negative IV test ? ?Additional Notes ?1st attempt ?Pt. Evaluated and documentation done after procedure finished. ?Patient identified. Risks/Benefits/Options discussed with patient including but not limited to bleeding, infection, nerve damage, paralysis, failed block, incomplete pain control, headache, blood pressure changes, nausea, vomiting, reactions to medication both or allergic, itching and postpartum back pain. Confirmed with bedside nurse the patient's most recent platelet count. Confirmed with patient that they are not currently taking any anticoagulation, have any bleeding history or any family history of bleeding disorders. Patient expressed understanding and wished to proceed. All questions were answered. Sterile technique was used throughout the entire procedure. Please see nursing notes for vital signs. Test dose was given through epidural catheter and negative prior to continuing to dose epidural or start infusion. Warning signs of high  block given to the patient including shortness of breath, tingling/numbness in hands, complete motor block, or any concerning symptoms with instructions to call for help. Patient was given instructions on fall risk and not to get out of bed. All questions and concerns addressed with instructions to call with any issues or inadequate analgesia.   ? ?Patient initially with blood on aspiration of epidural and positive for tinnitus with test dose.  At that point the epidural was removed and flushed with saline.  A second attempt was made afterwards that had a negative test.  No further complications. ? ?Patient tolerated the insertion well without immediate complications.Reason for block:procedure for pain ? ? ? ?

## 2021-06-27 NOTE — Lactation Note (Signed)
This note was copied from a baby's chart. ?Lactation Consultation Note ? ?Patient Name: Sherry Lutz ?Today's Date: 06/27/2021 ?Reason for consult: Initial assessment;Primapara;Early term 37-38.6wks ?Age:26 years ? ?Lactation visit. Mom is P1, SVD 10 hours ago. ? ?Maternal Data ?Has patient been taught Hand Expression?: Yes ?Does the patient have breastfeeding experience prior to this delivery?: No ? ?Feeding ?Mother's Current Feeding Choice: Breast Milk ? ?Baby has had 1 documented feeding since delivery almost 10hrs prior. Baby has been sleepy for the majority of the day, attempts made but baby did not latch. ? ?LATCH Score ?Latch: Grasps breast easily, tongue down, lips flanged, rhythmical sucking. ? ?Audible Swallowing: None (sat at the breast latched onto the nipple; no activity) ? ?Type of Nipple: Everted at rest and after stimulation ? ?Comfort (Breast/Nipple): Soft / non-tender ? ?Hold (Positioning): Assistance needed to correctly position infant at breast and maintain latch. ? ?LATCH Score: 7 ? ?Lactation at bedside to assist with arousal of infant and attempted feeding. Baby would latch to breast but without sucking pattern sitting contently. Several attempts made. ?LC worked with mom to hand express(mom was difficult to hand express, 1 small drop)/spoon feed- during this baby did have a large emesis (clear/frothy) with subsequent spit-ups following. ? ?Lactation Tools Discussed/Used ?Tools: Pump;Bottle ?Breast pump type: Double-Electric Breast Pump ?Pump Education: Setup, frequency, and cleaning;Milk Storage ?Reason for Pumping: 37 week; sleepy baby ?Pumping frequency: instructed minimum every 4hrs if baby does not latch ?Pumped volume: 5 mL ? ?Interventions ?Interventions: Breast feeding basics reviewed;Assisted with latch;Hand express;Breast compression;Support pillows;DEBP;Education;Pace feeding ? ?Parents and LC developed feeding plan moving forward to account for baby being a 37week and  sleepy. ?-Attempt to feed minimum of every 4 hours with goal of baby feeding from breast ?-If baby remains sleepy and unable to latch mom has been instructed to pump ?-Goal of EBM is 53mL, if less than that discussion of supplement to match recommended intake of 5-42mL (formula). Mom desires what is best for baby and to ensure that baby is eating. ? ?Discharge ?  ? ?Consult Status ?Consult Status: Follow-up ?Date: 06/28/21 ?Follow-up type: In-patient ? ? ? ?Danford Bad ?06/27/2021, 5:34 PM ? ? ? ?

## 2021-06-27 NOTE — Anesthesia Preprocedure Evaluation (Signed)
Anesthesia Evaluation  ?Patient identified by MRN, date of birth, ID band ?Patient awake ? ? ? ?Reviewed: ?Allergy & Precautions, H&P , NPO status , Patient's Chart, lab work & pertinent test results, reviewed documented beta blocker date and time  ? ?History of Anesthesia Complications ?Negative for: history of anesthetic complications ? ?Airway ?Mallampati: II ? ?TM Distance: >3 FB ?Neck ROM: full ? ? ? Dental ?no notable dental hx. ? ?  ?Pulmonary ?neg shortness of breath, asthma , neg recent URI,  ?  ?Pulmonary exam normal ?breath sounds clear to auscultation ? ? ? ? ? ? Cardiovascular ?Exercise Tolerance: Good ?negative cardio ROS ?Normal cardiovascular exam ?Rhythm:regular Rate:Normal ? ? ?  ?Neuro/Psych ? Headaches, neg Seizures  Neuromuscular disease (CRPS 1) negative psych ROS  ? GI/Hepatic ?Neg liver ROS, GERD  ,  ?Endo/Other  ?negative endocrine ROS ? Renal/GU ?negative Renal ROS  ?negative genitourinary ?  ?Musculoskeletal ? ? Abdominal ?  ?Peds ? Hematology ?negative hematology ROS ?(+)   ?Anesthesia Other Findings ?Past Medical History: ?No date: Asthma ?No date: RSD (reflex sympathetic dystrophy) ?    Comment:  foot ? ? Reproductive/Obstetrics ?(+) Pregnancy ? ?  ? ? ? ? ? ? ? ? ? ? ? ? ? ?  ?  ? ? ? ? ? ? ? ? ?Anesthesia Physical ?Anesthesia Plan ? ?ASA: 2 ? ?Anesthesia Plan: Epidural  ? ?Post-op Pain Management:   ? ?Induction:  ? ?PONV Risk Score and Plan:  ? ?Airway Management Planned:  ? ?Additional Equipment:  ? ?Intra-op Plan:  ? ?Post-operative Plan:  ? ?Informed Consent: I have reviewed the patients History and Physical, chart, labs and discussed the procedure including the risks, benefits and alternatives for the proposed anesthesia with the patient or authorized representative who has indicated his/her understanding and acceptance.  ? ? ? ?Dental Advisory Given ? ?Plan Discussed with: Anesthesiologist, CRNA and Surgeon ? ?Anesthesia Plan Comments:    ? ? ? ? ? ? ?Anesthesia Quick Evaluation ? ?

## 2021-06-27 NOTE — Progress Notes (Signed)
? ?Subjective:  ?Feeling contractions. Headache now "7/10" it looked in the front. Declines medication.  ? ?Objective:  ? ?Vitals: Blood pressure 130/80, pulse 74, temperature 98.5 ?F (36.9 ?C), temperature source Oral, resp. rate 18, height 5\' 2"  (1.575 m), weight 72.6 kg, last menstrual period 09/27/2020. ?General: NAD ?Abdomen:non tender ?Cervical Exam:  ?Dilation: 5 ?Effacement (%): 50 ?Station: 0 ?Exam by:: Dessa Ledee CNM ? ?FHT: baseline 125, moderate variability, pos accel, neg decel ?Toco:q 1-4, mild-mod, soft resting tone ?Pitocin @ 10 milli-units.min  ?Results for orders placed or performed during the hospital encounter of 06/26/21 (from the past 24 hour(s))  ?CBC     Status: Abnormal  ? Collection Time: 06/26/21 10:01 AM  ?Result Value Ref Range  ? WBC 8.6 4.0 - 10.5 K/uL  ? RBC 3.84 (L) 3.87 - 5.11 MIL/uL  ? Hemoglobin 11.7 (L) 12.0 - 15.0 g/dL  ? HCT 34.7 (L) 36.0 - 46.0 %  ? MCV 90.4 80.0 - 100.0 fL  ? MCH 30.5 26.0 - 34.0 pg  ? MCHC 33.7 30.0 - 36.0 g/dL  ? RDW 12.5 11.5 - 15.5 %  ? Platelets 185 150 - 400 K/uL  ? nRBC 0.0 0.0 - 0.2 %  ?Type and screen     Status: None  ? Collection Time: 06/26/21 10:01 AM  ?Result Value Ref Range  ? ABO/RH(D) O POS   ? Antibody Screen NEG   ? Sample Expiration    ?  06/29/2021,2359 ?Performed at [facility], [address], [place], [place] ?  ?Comprehensive metabolic panel     Status: Abnormal  ? Collection Time: 06/26/21 10:01 AM  ?Result Value Ref Range  ? Sodium 137 135 - 145 mmol/L  ? Potassium 3.2 (L) 3.5 - 5.1 mmol/L  ? Chloride 108 98 - 111 mmol/L  ? CO2 20 (L) 22 - 32 mmol/L  ? Glucose, Bld 102 (H) 70 - 99 mg/dL  ? BUN <5 (L) 6 - 20 mg/dL  ? Creatinine, Ser 0.49 0.44 - 1.00 mg/dL  ? Calcium 8.8 (L) 8.9 - 10.3 mg/dL  ? Total Protein 6.5 6.5 - 8.1 g/dL  ? Albumin 3.2 (L) 3.5 - 5.0 g/dL  ? AST 25 15 - 41 U/L  ? ALT 13 0 - 44 U/L  ? Alkaline Phosphatase 180 (H) 38 - 126 U/L  ? Total Bilirubin 0.5 0.3 - 1.2 mg/dL  ? GFR, Estimated >60 >60  mL/min  ? Anion gap 9 5 - 15  ?Uric acid     Status: None  ? Collection Time: 06/26/21 10:01 AM  ?Result Value Ref Range  ? Uric Acid, Serum 5.9 2.5 - 7.1 mg/dL  ?Lactate dehydrogenase     Status: None  ? Collection Time: 06/26/21 10:01 AM  ?Result Value Ref Range  ? LDH 125 98 - 192 U/L  ?ABO/Rh     Status: None  ? Collection Time: 06/26/21 11:45 AM  ?Result Value Ref Range  ? ABO/RH(D)    ?  O POS ?Performed at [facility], [address] [place], [place] ?  ?Protein / creatinine ratio, urine     Status: Abnormal  ? Collection Time: 06/26/21 12:49 PM  ?Result Value Ref Range  ? Creatinine, Urine 29 mg/dL  ? Total Protein, Urine 7 mg/dL  ? Protein Creatinine Ratio 0.24 (H) 0.00 - 0.15 mg/mg[Cre]  ? ? ?Assessment:  ? 26 y.o. G1P0 [redacted]w[redacted]d admitted for induction of labor ? ?Plan:  ?1) Labor -FB placed  at 1040, 2nd dose Cytotec placed at 1440, FB out, Pitocin started at 2118,  AROM for clear at 0018 ?  ?2) Fetus - category 1 tracing ?  ?3) ID: GBS neg, Membranes: AROM at 0018 for clear fluid  ?  ?4) Pain management: aware of all options, will ask if desired  ?  ?Carie Caddy, CNM  ?[facility]  ?@TODAY @  ?12:30 AM  ? ?  ?

## 2021-06-27 NOTE — Progress Notes (Signed)
RN helped patient sit up on the side of the bed. Patient began to state that "she did not feel so good". Patient states that she was light headed and her face became pale in color. RN assisted patient in lying back in the bed.  ?

## 2021-06-28 DIAGNOSIS — Z3A37 37 weeks gestation of pregnancy: Secondary | ICD-10-CM

## 2021-06-28 LAB — CBC
HCT: 25.8 % — ABNORMAL LOW (ref 36.0–46.0)
Hemoglobin: 8.4 g/dL — ABNORMAL LOW (ref 12.0–15.0)
MCH: 30.4 pg (ref 26.0–34.0)
MCHC: 32.6 g/dL (ref 30.0–36.0)
MCV: 93.5 fL (ref 80.0–100.0)
Platelets: 184 10*3/uL (ref 150–400)
RBC: 2.76 MIL/uL — ABNORMAL LOW (ref 3.87–5.11)
RDW: 12.7 % (ref 11.5–15.5)
WBC: 14.3 10*3/uL — ABNORMAL HIGH (ref 4.0–10.5)
nRBC: 0 % (ref 0.0–0.2)

## 2021-06-28 MED ORDER — NORETHINDRONE 0.35 MG PO TABS: 1.0000 | ORAL_TABLET | Freq: Every day | ORAL | 11 refills | Status: DC

## 2021-06-28 MED ORDER — FERROUS SULFATE 325 (65 FE) MG PO TBEC: 325.0000 mg | DELAYED_RELEASE_TABLET | Freq: Two times a day (BID) | ORAL | 1 refills | Status: DC

## 2021-06-28 NOTE — Anesthesia Postprocedure Evaluation (Signed)
Anesthesia Post Note ? ?Patient: Sherry Lutz ? ?Procedure(s) Performed: AN AD HOC LABOR EPIDURAL ? ?Patient location during evaluation: Mother Baby ?Anesthesia Type: Epidural ?Level of consciousness: awake and alert ?Pain management: pain level controlled ?Vital Signs Assessment: post-procedure vital signs reviewed and stable ?Respiratory status: spontaneous breathing, nonlabored ventilation and respiratory function stable ?Cardiovascular status: stable ?Postop Assessment: no headache, no backache and epidural receding ?Anesthetic complications: no ? ? ?No notable events documented. ? ? ?Last Vitals:  ?Vitals:  ? 06/27/21 2300 06/28/21 0714  ?BP: 119/87 107/61  ?Pulse: 79 71  ?Resp: 18 16  ?Temp: 36.8 ?C 36.6 ?C  ?SpO2: 100% 100%  ?  ?Last Pain:  ?Vitals:  ? 06/28/21 0714  ?TempSrc: Oral  ?PainSc:   ? ? ?  ?  ?  ?  ?  ?  ? ?Hedda Slade ? ? ? ? ?

## 2021-06-28 NOTE — Progress Notes (Signed)
Patient discharged home with infant. Discharge instructions and prescriptions given and reviewed with patient. Patient verbalized understanding.  ? ?Explained importance of calling and scheduling follow-up appointment for a visit in 2-weeks and 6-weeks!  ? ?Lactation visited with patient prior to discharge. ? ?Escorted out by volunteers. ?

## 2021-06-28 NOTE — Lactation Note (Addendum)
This note was copied from a baby's chart. ?Lactation Consultation Note ? ?Patient Name: Sherry Lutz ?Today's Date: 06/28/2021 ?Reason for consult: Follow-up assessment;Primapara;Early term 37-38.6wks ?Age:25 hours ? ?Maternal Data ?Has patient been taught Hand Expression?: Yes ?Does the patient have breastfeeding experience prior to this delivery?: No ? ?Feeding ?Mother's Current Feeding Choice: Breast Milk and Formula ?Assisted mom with latching baby to left breast, no feeding since before circ, baby sleepy but stimulated awake, tight jaw but able to latch on left breast in side lying position and latched well but needed stimulation to suck and jaw pressure to widen latch x 3 min, switched to right breast in side lying position and more awake now, latched well to right after having mom use manual pump to soften areola as it was sl firm and to elongate nipple, baby latched easier on this side, still needed stimulation to continue nursing but more swallows heard, needed jaw  pressure to keep wide latch as it became painful for mom if this wasn't maintained, nipple sl pinched when baby came off, baby tends to keep tongue in roof of mouth, baby also given 1 cc EBM that mom obtained when pumping breast to elongate nipple, by syringe while baby sucking on my gloved finger ?LATCH Score ?Latch: Repeated attempts needed to sustain latch, nipple held in mouth throughout feeding, stimulation needed to elicit sucking reflex. (sleepy after circ) ? ?Audible Swallowing: Spontaneous and intermittent ? ?Type of Nipple: Everted at rest and after stimulation ? ?Comfort (Breast/Nipple): Filling, red/small blisters or bruises, mild/mod discomfort ? ?Hold (Positioning): Assistance needed to correctly position infant at breast and maintain latch. ? ?LATCH Score: 7 ? ? ?Lactation Tools Discussed/Used ? LC name and no updated on white board ? ?Interventions ?Interventions: Breast feeding basics reviewed;Assisted with latch;Skin to  skin;Hand express;Pre-pump if needed;Adjust position;Support pillows;Position options;Hand pump;Education ? ?Discharge ?Discharge Education: Engorgement and breast care;Warning signs for feeding baby ?Pump: Personal ?WIC Program: No ? ?Consult Status ?Consult Status: PRN ?Date: 06/28/21 ?Follow-up type: In-patient ? ? ? ?Dyann Kief ?06/28/2021, 12:28 PM ? ? ? ?

## 2021-06-28 NOTE — Discharge Instructions (Addendum)
Discharge Instructions:  ? ?Follow-up Appointment: Call and schedule a follow-up appointment for a visit in 2 weeks and 6 weeks at [facility] with Carie Caddy, CNM!  ? ?If there are any new medications, they have been ordered and will be available for pickup at the listed pharmacy on your way home from the hospital.  ? ?Call office if you have any of the following: headache, visual changes, fever >101.0 F, chills, shortness of breath, breast concerns, excessive vaginal bleeding, incision drainage or problems, leg pain or redness, depression or any other concerns. If you have vaginal discharge with an odor, let your doctor know.  ? ?It is normal to bleed for up to 6 weeks. You should not soak through more than 1 pad in 1 hour. If you have a blood clot larger than your fist with continued bleeding, call your doctor.  ? ?Activity: Do not lift > 10 lbs for 6 weeks (do not lift anything heavier than your baby). ?No intercourse, tampons, swimming pools, hot tubs, baths (only showers) for 6 weeks.  ?No driving for 1-2 weeks. ?Continue prenatal vitamin, especially if breastfeeding. ?Increase calories and fluids (water) while breastfeeding.  ? ?Your milk will come in, in the next couple of days (right now it is colostrum). You may have a slight fever when your milk comes in, but it should go away on its own.  If it does not, and rises above 101 F please call the doctor. You will also feel achy and your breasts will be firm. They will also start to leak. If you are breastfeeding, continue as you have been and you can pump/express milk for comfort.  ? ?If you have too much milk, your breasts can become engorged, which could lead to mastitis. This is an infection of the milk ducts. It can be very painful and you will need to notify your doctor to obtain a prescription for antibiotics. You can also treat it with a shower or hot/cold compress.  ? ?For concerns about your baby, please call your pediatrician.  ?For  breastfeeding concerns, the lactation consultant can be reached at [phone].  ? ?Postpartum blues (feelings of happy one minute and sad another minute) are normal for the first few weeks but if it gets worse let your doctor know.  ? ?Congratulations! We enjoyed caring for you and your new bundle of joy!   ?

## 2021-07-01 ENCOUNTER — Encounter: Payer: 59 | Admitting: Obstetrics

## 2021-07-10 ENCOUNTER — Ambulatory Visit (INDEPENDENT_AMBULATORY_CARE_PROVIDER_SITE_OTHER): Payer: 59 | Admitting: Licensed Practical Nurse

## 2021-07-10 ENCOUNTER — Encounter: Payer: Self-pay | Admitting: Licensed Practical Nurse

## 2021-07-10 DIAGNOSIS — Z23 Encounter for immunization: Secondary | ICD-10-CM

## 2021-07-11 NOTE — Progress Notes (Signed)
?Postpartum Visit  ?Chief Complaint:  ?Chief Complaint  ?Patient presents with  ? Postpartum Care  ? ? ?History of Present Illness: Patient is a 26 y.o. G1P1001 presents for postpartum visit. ? ?Date of delivery: 06/27/2021, IOL for headaches  ?Type of delivery: Vaginal delivery - Vacuum or forceps assisted  no ?Episiotomy No.  ?Laceration: yes  ?Pregnancy or labor problems:  yes Persistent headaches  ?Any problems since the delivery:  continues to have Headaches but they are not as severe, they respond to Tylenol, Cassie feels like it is r/t to lack of sleep. ?Sleep as expected with newborn ?Perineum: still sore, felt some pulling the other day ?Bleeding is red and light in amount like a period ?Mood is good but has been emotional  ? ? ?Newborn Details:  ?SINGLETON :  ?1. Baby's name: boy . Birth weight: 3360grams ?Maternal Details:  ?Breast Feeding:  no Stopped because the infant never latched.  ?Post partum depression/anxiety noted:  no ?Edinburgh Post-Partum Depression Score:  3  ?Date of last PAP: 2021  normal  ? ?Past Medical History:  ?Diagnosis Date  ? Asthma   ? RSD (reflex sympathetic dystrophy)   ? foot  ? ? ?Past Surgical History:  ?Procedure Laterality Date  ? INTERCOSTAL NERVE BLOCK    ? ? ?Prior to Admission medications   ?Medication Sig Start Date End Date Taking? Authorizing Provider  ?acetaminophen (TYLENOL) 650 MG CR tablet Take 1,000 mg by mouth every 8 (eight) hours as needed for pain.   Yes [provider]  ?Cholecalciferol 50 MCG (2000 UT) CAPS Take by mouth.   Yes [provider]  ?ferrous sulfate 325 (65 FE) MG EC tablet Take 1 tablet (325 mg total) by mouth 2 (two) times daily with a meal. 06/28/21  Yes Tresea Mall, CNM  ?montelukast (SINGULAIR) 10 MG tablet Take 10 mg by mouth daily. 11/28/18  Yes [provider]  ?Prenatal w/o A Vit-Fe Fum-FA (PRENATAL VITAMIN W/FE, FA) 29-1 MG CHEW    Yes [provider]  ?fluticasone (FLOVENT HFA) 110 MCG/ACT inhaler  Inhale into the lungs. 02/25/19 02/25/20  [provider]  ?norethindrone (MICRONOR) 0.35 MG tablet Take 1 tablet (0.35 mg total) by mouth daily. ?Patient not taking: Reported on 07/10/2021 07/28/21   Tresea Mall, CNM  ? ? ?Allergies  ?Allergen Reactions  ? Pregabalin   ?  Other reaction(s): Hallucinations  ? Red Dye Rash  ?  ? ?Social History  ? ?Socioeconomic History  ? Marital status: Married  ?  Spouse name: Not on file  ? Number of children: Not on file  ? Years of education: Not on file  ? Highest education level: Not on file  ?Occupational History  ? Not on file  ?Tobacco Use  ? Smoking status: Never  ? Smokeless tobacco: Never  ?Vaping Use  ? Vaping Use: Never used  ?Substance and Sexual Activity  ? Alcohol use: No  ? Drug use: No  ? Sexual activity: Yes  ?  Birth control/protection: Pill  ?Other Topics Concern  ? Not on file  ?Social History Narrative  ? Lives with husband  ? ?Social Determinants of Health  ? ?Financial Resource Strain: Not on file  ?Food Insecurity: Not on file  ?Transportation Needs: Not on file  ?Physical Activity: Not on file  ?Stress: Not on file  ?Social Connections: Not on file  ?Intimate Partner Violence: Not on file  ? ? ?Family History  ?Problem Relation Age of Onset  ? Healthy  Mother   ? Healthy Father   ? ? ?Review of Systems  ?Constitutional: Negative.   ?Respiratory: Negative.    ?Cardiovascular: Negative.   ?Gastrointestinal: Negative.   ?Genitourinary: Negative.   ?Endo/Heme/Allergies: Negative.   ?Psychiatric/Behavioral: Negative.     ? ?Physical Exam ?BP 120/80   Ht 5\' 2"  (1.575 m)   Wt 140 lb (63.5 kg)   Breastfeeding No   BMI 25.61 kg/m?   ?Physical Exam ?Constitutional:   ?   Appearance: Normal appearance.  ?Genitourinary:  ?   Genitourinary Comments: Vaginal laceration healing, stitches removed, area of white tissue present where not fully healed,  no redness or swelling no exudate   ?Abdominal:  ?   General: Abdomen is flat. There is no distension.  ?    Palpations: Abdomen is soft. There is no mass.  ?   Tenderness: There is no abdominal tenderness.  ?Neurological:  ?   Mental Status: She is alert.  ?  ? ?Assessment: 26 y.o. G1P1001 presenting for 6 week postpartum visit ? ?Plan: ?Problem List Items Addressed This Visit   ?None ?Visit Diagnoses   ? ? Need for diphtheria-tetanus-pertussis (Tdap) vaccine      ? ?  ? ? ? ?1) Contraception Education given regarding options for contraception, including oral contraceptives. Has script for POP ? ?2)  Pap ?- ASCCP guidelines and rational discussed.  Patient opts for 3 screening interval ? ?3) Patient underwent screening for postpartum depression with No  concerns noted. ? ?4) Follow up 4 weeks  ? ?5) soak in tub twice a day to encourage healing of laceration  ? ?22, CNM  ?Carie Caddy, [facility]  ?07/11/21  ?6:11 PM  ? ?

## 2021-08-06 ENCOUNTER — Ambulatory Visit: Payer: 59 | Admitting: Licensed Practical Nurse

## 2021-08-07 ENCOUNTER — Other Ambulatory Visit (HOSPITAL_COMMUNITY)
Admission: RE | Admit: 2021-08-07 | Discharge: 2021-08-07 | Disposition: A | Discharge status: Home / Self Care | Payer: 59 | Source: Ambulatory Visit | Attending: Licensed Practical Nurse | Admitting: Licensed Practical Nurse

## 2021-08-07 ENCOUNTER — Ambulatory Visit (INDEPENDENT_AMBULATORY_CARE_PROVIDER_SITE_OTHER): Payer: 59 | Admitting: Licensed Practical Nurse

## 2021-08-07 DIAGNOSIS — N730 Acute parametritis and pelvic cellulitis: Secondary | ICD-10-CM | POA: Diagnosis not present

## 2021-08-07 DIAGNOSIS — D509 Iron deficiency anemia, unspecified: Secondary | ICD-10-CM

## 2021-08-07 MED ORDER — DOXYCYCLINE HYCLATE 100 MG PO CAPS: 100.0000 mg | ORAL_CAPSULE | Freq: Two times a day (BID) | ORAL | 0 refills | Status: DC

## 2021-08-07 MED ORDER — CEFTRIAXONE SODIUM 250 MG IJ SOLR
250.0000 mg | Freq: Once | INTRAMUSCULAR | Status: AC
  Administered 2021-08-07: 250 mg via INTRAMUSCULAR

## 2021-08-07 MED ORDER — METRONIDAZOLE 500 MG PO TABS: 500.0000 mg | ORAL_TABLET | Freq: Two times a day (BID) | ORAL | 0 refills | Status: DC

## 2021-08-07 NOTE — Progress Notes (Signed)
Postpartum Visit  Chief Complaint:  Chief Complaint  Patient presents with   Postpartum Care    6 weeks     History of Present Illness: Patient is a 26 y.o. G1P1001 presents for postpartum visit.  Date of delivery: 06/27/2021, IOL for headaches  Type of delivery: Vaginal delivery - Vacuum or forceps assisted  no Episiotomy No.  Laceration: yes  Pregnancy or labor problems:  yes Persistent headaches  Any problems since the delivery: Sherry Lutz reports that she does not feel right in the vaginal area, it feels like something is falling our when she urinates, and has some discomfort in her labia near where the stiches were placed.  Has not had any issues with BM, does not leak urine.  Mood" admits to "having a rough time" as infant is colicky, gets anxious infant is crying and she is unable to soothe him. Denies depression, feels what she is experiencing is normal and related to the infant's behavior-he does not sleep for long stretches of time, has a lot of support, is able to get time alone.  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 Baby's name: boy  Birth weight: 3360grams Maternal Details:  Breast Feeding:  no Post partum depression/anxiety noted:  Some  Edinburgh Post-Partum Depression Score:  6  Date of last PAP: 2021 at [facility]  normal   Past Medical History:  Diagnosis Date   Asthma    RSD (reflex sympathetic dystrophy)    foot    Past Surgical History:  Procedure Laterality Date   INTERCOSTAL NERVE BLOCK      Prior to Admission medications   Medication Sig Start Date End Date Taking? Authorizing Provider  acetaminophen (TYLENOL) 650 MG CR tablet Take 1,000 mg by mouth every 8 (eight) hours as needed for pain.   Yes [provider]  doxycycline (VIBRAMYCIN) 100 MG capsule Take 1  capsule (100 mg total) by mouth 2 (two) times daily. 08/07/21  Yes Dyamond Tolosa, Courtney Heys, CNM  metroNIDAZOLE (FLAGYL) 500 MG tablet Take 1 tablet (500 mg total) by mouth 2 (two) times daily for 14 days. 08/07/21 08/21/21 Yes Draycen Leichter, Courtney Heys, CNM  montelukast (SINGULAIR) 10 MG tablet Take 10 mg by mouth daily. 11/28/18  Yes [provider]  Cholecalciferol 50 MCG (2000 UT) CAPS Take by mouth.    [provider]  ferrous sulfate 325 (65 FE) MG EC tablet Take 1 tablet (325 mg total) by mouth 2 (two) times daily with a meal. Patient not taking: Reported on 08/07/2021 06/28/21   Tresea Mall, CNM  fluticasone (FLOVENT HFA) 110 MCG/ACT inhaler Inhale into the lungs. 02/25/19 02/25/20  [provider]  norethindrone (MICRONOR) 0.35 MG tablet Take 1 tablet (0.35 mg total) by mouth daily. Patient not taking: Reported on 07/10/2021 07/28/21   Tresea Mall, CNM  Prenatal w/o A Vit-Fe Fum-FA (PRENATAL VITAMIN W/FE, FA) 29-1 MG CHEW     [provider]    Allergies  Allergen Reactions   Pregabalin     Other reaction(s): Hallucinations   Red Dye Rash     Social History   Socioeconomic History   Marital status: Married    Spouse name: Not on file   Number of children: Not on file   Years of education: Not on file  Highest education level: Not on file  Occupational History   Not on file  Tobacco Use   Smoking status: Never   Smokeless tobacco: Never  Vaping Use   Vaping Use: Never used  Substance and Sexual Activity   Alcohol use: No   Drug use: No   Sexual activity: Yes    Birth control/protection: Pill  Other Topics Concern   Not on file  Social History Narrative   Lives with husband   Social Determinants of Health   Financial Resource Strain: Not on file  Food Insecurity: Not on file  Transportation Needs: Not on file  Physical Activity: Not on file  Stress: Not on file  Social Connections: Not on file  Intimate Partner Violence: Not on file     Family History  Problem Relation Age of Onset   Healthy Mother    Healthy Father     Review of Systems  Constitutional: Negative.   Respiratory: Negative.    Cardiovascular: Negative.   Gastrointestinal: Negative.   Genitourinary:        Feels like something is going to fall out when I urinate  Labial discomfort, "I just do not feel like myself"   Musculoskeletal: Negative.   Neurological: Negative.   Psychiatric/Behavioral:  Negative for depression. The patient is not nervous/anxious.     Physical Exam BP 120/72   Ht 5\' 2"  (1.575 m)   Wt 140 lb (63.5 kg)   Breastfeeding No   BMI 25.61 kg/m   Physical Exam Constitutional:      Appearance: Normal appearance.  Genitourinary:     Genitourinary Comments: Bimanual exam: uterus non gravid, a little tender with bimanual compression, positive CMT, adnexa non tender no masses   Laceration well healed, tenderness noted on left labia, labia a little swollen Vaginal flood prominent and a little firm, no redness or tenderness No vaginal tone, but pt unsure as how to contract perineal muscles  SSE: cervix pink, no lesions, white-green discharge present   No obvious bladder or uterine prolapse   Neck:     Thyroid: No thyroid mass or thyromegaly.  Cardiovascular:     Rate and Rhythm: Normal rate. Rhythm irregular.  Pulmonary:     Effort: Pulmonary effort is normal.     Breath sounds: Normal breath sounds.  Chest:     Comments: Breasts: no redness or masses, nipples intact bilaterally  Abdominal:     General: Abdomen is flat. There is no distension.     Palpations: Abdomen is soft.     Tenderness: There is no abdominal tenderness.  Musculoskeletal:        General: Normal range of motion.     Cervical back: Normal range of motion and neck supple.  Neurological:     General: No focal deficit present.     Mental Status: She is alert.  Skin:    General: Skin is warm.  Psychiatric:        Mood and Affect: Mood normal.         Behavior: Behavior normal.       Assessment: 26 y.o. G1P1001 presenting for 6 week postpartum visit  Plan: Problem List Items Addressed This Visit   None Visit Diagnoses     Postpartum care and examination    -  Primary   Relevant Orders   CBC   Cervicovaginal ancillary only   PID (acute pelvic inflammatory disease)       Relevant Medications   cefTRIAXone (ROCEPHIN) injection 250 mg (  Start on 08/07/2021 12:00 PM)   doxycycline (VIBRAMYCIN) 100 MG capsule   metroNIDAZOLE (FLAGYL) 500 MG tablet   Other Relevant Orders   Cervicovaginal ancillary only   Iron deficiency anemia, unspecified iron deficiency anemia type       Relevant Orders   CBC        1) Contraception Education given regarding options for contraception, including oral contraceptives.  2)  Pap - ASCCP guidelines and rational discussed.  Patient opts for 3 screening interval due 2024  3) Patient underwent screening for postpartum depression with NO  concerns noted.  4) Follow up 1 year for routine annual exam  5) Perineum: given continued discomfort, will schedule apt with Dr Valentino Saxonherry for exam. Recommend Pelvic floor PT, may self refer to [facility].   6) PID: offered treatment for CMT, pt desires antibiotics.   Carie CaddyLydia Kenlea Woodell, CNM  [facility], [facility]  08/10/21  4:25 PM

## 2021-08-08 ENCOUNTER — Encounter: Payer: Self-pay | Admitting: Licensed Practical Nurse

## 2021-08-08 LAB — CBC
Hematocrit: 39.6 % (ref 34.0–46.6)
Hemoglobin: 13.3 g/dL (ref 11.1–15.9)
MCH: 30.9 pg (ref 26.6–33.0)
MCHC: 33.6 g/dL (ref 31.5–35.7)
MCV: 92 fL (ref 79–97)
Platelets: 280 10*3/uL (ref 150–450)
RBC: 4.3 x10E6/uL (ref 3.77–5.28)
RDW: 11 % — ABNORMAL LOW (ref 11.7–15.4)
WBC: 4.7 10*3/uL (ref 3.4–10.8)

## 2021-08-08 LAB — CERVICOVAGINAL ANCILLARY ONLY
Bacterial Vaginitis (gardnerella): NEGATIVE
Candida Glabrata: NEGATIVE
Candida Vaginitis: NEGATIVE
Chlamydia: NEGATIVE
Comment: NEGATIVE
Comment: NEGATIVE
Comment: NEGATIVE
Comment: NEGATIVE
Comment: NEGATIVE
Comment: NORMAL
Neisseria Gonorrhea: NEGATIVE
Trichomonas: NEGATIVE

## 2021-08-16 ENCOUNTER — Ambulatory Visit (INDEPENDENT_AMBULATORY_CARE_PROVIDER_SITE_OTHER): Payer: 59 | Admitting: Obstetrics and Gynecology

## 2021-08-16 ENCOUNTER — Encounter: Payer: Self-pay | Admitting: Obstetrics and Gynecology

## 2021-08-16 DIAGNOSIS — R52 Pain, unspecified: Secondary | ICD-10-CM | POA: Diagnosis not present

## 2021-08-16 NOTE — Progress Notes (Signed)
HPI:      [name] is a 26 y.o. G1P1001 who LMP was Patient's last menstrual period was 08/15/2021.  Subjective:   Patient presents today to discuss vaginal discomfort since recent vaginal delivery. (7 weeks ago ) she states since delivery she has been in pain, constant throbbing, that has not eased with time. Patient also states pressure with urination, feels like "everything is going to fall out". She does not have a chronic pain condition in which she feels pain/discomfort longer than other people after injury or procedure.  She also has some delayed healing. She is not currently breast-feeding She has resumed OCPs for birth control and is cycling appropriately. She has not resumed intercourse.    Hx: The following portions of the patient's history were reviewed and updated as appropriate:             She  has a past medical history of Asthma and RSD (reflex sympathetic dystrophy). She does not have any pertinent problems on file. She  has a past surgical history that includes Intercostal nerve block. Her family history includes Healthy in her father and mother. She  reports that she has never smoked. She has never used smokeless tobacco. She reports that she does not drink alcohol and does not use drugs. She has a current medication list which includes the following prescription(s): acetaminophen, doxycycline, montelukast, norethindrone, and fluticasone. She is allergic to pregabalin and red dye.       Review of Systems:  Review of Systems  Constitutional: Denied constitutional symptoms, night sweats, recent illness, fatigue, fever, insomnia and weight loss.  Eyes: Denied eye symptoms, eye pain, photophobia, vision change and visual disturbance.  Ears/Nose/Throat/Neck: Denied ear, nose, throat or neck symptoms, hearing loss, nasal discharge, sinus congestion and sore throat.  Cardiovascular: Denied cardiovascular symptoms, arrhythmia, chest pain/pressure, edema, exercise  intolerance, orthopnea and palpitations.  Respiratory: Denied pulmonary symptoms, asthma, pleuritic pain, productive sputum, cough, dyspnea and wheezing.  Gastrointestinal: Denied, gastro-esophageal reflux, melena, nausea and vomiting.  Genitourinary: See HPI for additional information.  Musculoskeletal: Denied musculoskeletal symptoms, stiffness, swelling, muscle weakness and myalgia.  Dermatologic: Denied dermatology symptoms, rash and scar.  Neurologic: Denied neurology symptoms, dizziness, headache, neck pain and syncope.  Psychiatric: Denied psychiatric symptoms, anxiety and depression.  Endocrine: Denied endocrine symptoms including hot flashes and night sweats.   Meds:   Current Outpatient Medications on File Prior to Visit  Medication Sig Dispense Refill   acetaminophen (TYLENOL) 650 MG CR tablet Take 1,000 mg by mouth every 8 (eight) hours as needed for pain.     doxycycline (VIBRAMYCIN) 100 MG capsule Take 1 capsule (100 mg total) by mouth 2 (two) times daily. 14 capsule 0   montelukast (SINGULAIR) 10 MG tablet Take 10 mg by mouth daily.     norethindrone (MICRONOR) 0.35 MG tablet Take 1 tablet (0.35 mg total) by mouth daily. 30 tablet 11   fluticasone (FLOVENT HFA) 110 MCG/ACT inhaler Inhale into the lungs.     No current facility-administered medications on file prior to visit.      Objective:     There were no vitals filed for this visit. There were no vitals filed for this visit.            Physical examination   Pelvic:   Vulva: Normal appearance.  No lesions.  Patient has pain along the left vaginal introitus.  It does not radiate.  It is localized to this area.  Vagina: No lesions or  abnormalities noted.  Support: Normal pelvic support.  Urethra No masses tenderness or scarring.  Meatus Normal size without lesions or prolapse.  Cervix: Normal appearance.  No lesions.  Anus: Normal exam.  No lesions.  Perineum: Normal exam.  No lesions.  No significant pain at  perineum.        Bimanual   Uterus: Normal size.  Non-tender.  Mobile.  AV.  Adnexae: No masses.  Non-tender to palpation.  Cul-de-sac: Negative for abnormality.             Assessment:    G1P1001 Patient Active Problem List   Diagnosis Date Noted   Postpartum care following vaginal delivery 06/28/2021   Encounter for care or examination of lactating mother 06/28/2021   Headache 06/12/2021   Complex regional pain syndrome I of other specified site 12/10/2020   Reactive airway disease 01/28/2019   RSD (reflex sympathetic dystrophy) 06/04/2011     1. Pain related to vaginal delivery     No obvious lesions or abnormalities.  Pain localized to left labial   Plan:            1.  Expectant management of pain.  Over time I think this pain will resolve.  Nothing further to do at this time.  Must consider other options if pain persist.  I have reassured her and I believe that with some time many of her symptoms will resolve. Orders No orders of the defined types were placed in this encounter.   No orders of the defined types were placed in this encounter.     F/U  No follow-ups on file. I spent 23 minutes involved in the care of this patient preparing to see the patient by obtaining and reviewing her medical history (including labs, imaging tests and prior procedures), documenting clinical information in the electronic health record (EHR), counseling and coordinating care plans, writing and sending prescriptions, ordering tests or procedures and in direct communicating with the patient and medical staff discussing pertinent items from her history and physical exam.  Finis Bud, M.D. 08/16/2021 11:22 AM

## 2021-08-16 NOTE — Progress Notes (Signed)
Patient presents today to discuss vaginal discomfort since recent vaginal delivery. She states since delivery she has been in pain, constant throbbing, that has not eased with time. Patient also states pressure with urination, feels like "everything is going to fall out". She states no other concerns at this time.

## 2021-10-10 ENCOUNTER — Ambulatory Visit: Payer: 59 | Admitting: Obstetrics and Gynecology

## 2021-10-18 ENCOUNTER — Encounter: Payer: Self-pay | Admitting: Licensed Practical Nurse

## 2021-10-28 ENCOUNTER — Encounter: Payer: Self-pay | Admitting: Licensed Practical Nurse

## 2021-11-01 ENCOUNTER — Encounter: Payer: Self-pay | Admitting: Obstetrics and Gynecology

## 2021-11-01 ENCOUNTER — Ambulatory Visit (INDEPENDENT_AMBULATORY_CARE_PROVIDER_SITE_OTHER): Payer: 59 | Admitting: Obstetrics and Gynecology

## 2021-11-01 VITALS — BP 112/78 | Ht 62.0 in | Wt 133.4 lb

## 2021-11-01 DIAGNOSIS — N939 Abnormal uterine and vaginal bleeding, unspecified: Secondary | ICD-10-CM

## 2021-11-01 DIAGNOSIS — R52 Pain, unspecified: Secondary | ICD-10-CM

## 2021-11-01 MED ORDER — LEVONORGEST-ETH ESTRAD 91-DAY 0.15-0.03 &0.01 MG PO TABS: 1.0000 | ORAL_TABLET | Freq: Every day | ORAL | 1 refills | Status: DC

## 2021-11-01 NOTE — Progress Notes (Signed)
HPI:      [name] is a 26 y.o. G1P1001 who LMP was Patient's last menstrual period was 10/22/2021 (approximate).  Subjective:   She presents today stating that occasionally when she stands up and is moving around she feels lightheaded.  She reports irregular heavy vaginal bleeding.  In addition she still has pelvic pain/vaginal pain from her previous childbirth experience.  It is located mainly on the left side where she had a vaginal tear repaired.  She says that it causes pain with general movement.  She is unable to have intercourse.  She also states that it hurts even when wiping after urination.  She has also continued to experience some pelvic pressure type symptoms that may be consistent with some uterine prolapse.  She has been doing some pelvic floor physical therapy at home. Although she is not breast-feeding she is on progesterone only birth control but it is obviously not controlling her cycles or her bleeding.    Hx: The following portions of the patient's history were reviewed and updated as appropriate:             She  has a past medical history of Asthma and RSD (reflex sympathetic dystrophy). She does not have any pertinent problems on file. She  has a past surgical history that includes Intercostal nerve block. Her family history includes Healthy in her father and mother. She  reports that she has never smoked. She has never used smokeless tobacco. She reports that she does not drink alcohol and does not use drugs. She has a current medication list which includes the following prescription(s): montelukast, norethindrone, acetaminophen, and fluticasone. She is allergic to pregabalin and red dye.       Review of Systems:  Review of Systems  Constitutional: Denied constitutional symptoms, night sweats, recent illness, fatigue, fever, insomnia and weight loss.  Eyes: Denied eye symptoms, eye pain, photophobia, vision change and visual disturbance.   Ears/Nose/Throat/Neck: Denied ear, nose, throat or neck symptoms, hearing loss, nasal discharge, sinus congestion and sore throat.  Cardiovascular: Denied cardiovascular symptoms, arrhythmia, chest pain/pressure, edema, exercise intolerance, orthopnea and palpitations.  Respiratory: Denied pulmonary symptoms, asthma, pleuritic pain, productive sputum, cough, dyspnea and wheezing.  Gastrointestinal: Denied, gastro-esophageal reflux, melena, nausea and vomiting.  Genitourinary: See HPI for additional information.  Musculoskeletal: Denied musculoskeletal symptoms, stiffness, swelling, muscle weakness and myalgia.  Dermatologic: Denied dermatology symptoms, rash and scar.  Neurologic: Denied neurology symptoms, dizziness, headache, neck pain and syncope.  Psychiatric: Denied psychiatric symptoms, anxiety and depression.  Endocrine: Denied endocrine symptoms including hot flashes and night sweats.   Meds:   Current Outpatient Medications on File Prior to Visit  Medication Sig Dispense Refill   montelukast (SINGULAIR) 10 MG tablet Take 10 mg by mouth daily.     norethindrone (MICRONOR) 0.35 MG tablet Take 1 tablet (0.35 mg total) by mouth daily. 30 tablet 11   acetaminophen (TYLENOL) 650 MG CR tablet Take 1,000 mg by mouth every 8 (eight) hours as needed for pain.     fluticasone (FLOVENT HFA) 110 MCG/ACT inhaler Inhale into the lungs.     No current facility-administered medications on file prior to visit.      Objective:     Vitals:   11/01/21 0901  BP: 112/78   Filed Weights   11/01/21 0901  Weight: 133 lb 6.4 oz (60.5 kg)  Assessment:    G1P1001 Patient Active Problem List   Diagnosis Date Noted   Postpartum care following vaginal delivery 06/28/2021   Encounter for care or examination of lactating mother 06/28/2021   Headache 06/12/2021   Complex regional pain syndrome I of other specified site 12/10/2020   Reactive airway disease 01/28/2019    RSD (reflex sympathetic dystrophy) 06/04/2011     1. Pain related to vaginal delivery   2. Abnormal vaginal bleeding     Possibly pain related to vaginal tear and repair.  Seems to be disrupting her life quite a bit.  It is not improving with time.  Irregular heavy bleeding while taking Micronor.    Plan:            1.  CBC to check hemoglobin  2.  Recommend patient resume oral iron  3.  To strongly consider surgical repair of vaginal tear to alleviate vaginal pain from childbirth.  4.  Stop Micronor and begin combination OCPs.  See if this makes any difference in her pelvic pain pressure symptoms and gives [place] better cycle control so that she is not regularly having heavy bleeding.  Will start Seasonique. Orders Orders Placed This Encounter  Procedures   CBC    No orders of the defined types were placed in this encounter.     F/U  No follow-ups on file. I spent 31 minutes involved in the care of this patient preparing to see the patient by obtaining and reviewing her medical history (including labs, imaging tests and prior procedures), documenting clinical information in the electronic health record (EHR), counseling and coordinating care plans, writing and sending prescriptions, ordering tests or procedures and in direct communicating with the patient and medical staff discussing pertinent items from her history and physical exam.  Elonda Husky, M.D. 11/01/2021 11:17 AM

## 2021-11-01 NOTE — Progress Notes (Signed)
Patient presents for follow-up of postpartum vaginal pain. She states over the past few she is still experiencing the same symptoms and pressure along with irregular bleeding every other week. She is taking Micronor daily, not breastfeeding. Patient reports moderate to heavy cycles every other week with large amount of dark colored clots. She has been dizzy lately while sitting and once she stands. No other concerns at this time.

## 2021-11-02 LAB — CBC
Hematocrit: 40.3 % (ref 34.0–46.6)
Hemoglobin: 13.3 g/dL (ref 11.1–15.9)
MCH: 29 pg (ref 26.6–33.0)
MCHC: 33 g/dL (ref 31.5–35.7)
MCV: 88 fL (ref 79–97)
Platelets: 280 10*3/uL (ref 150–450)
RBC: 4.59 x10E6/uL (ref 3.77–5.28)
RDW: 11.8 % (ref 11.7–15.4)
WBC: 5.7 10*3/uL (ref 3.4–10.8)

## 2021-11-03 ENCOUNTER — Encounter: Payer: Self-pay | Admitting: Obstetrics and Gynecology

## 2021-11-04 ENCOUNTER — Other Ambulatory Visit: Payer: Self-pay | Admitting: Obstetrics and Gynecology

## 2021-11-04 MED ORDER — NORETHIN ACE-ETH ESTRAD-FE 1-20 MG-MCG(24) PO TABS: 1.0000 | ORAL_TABLET | Freq: Every day | ORAL | 0 refills | Status: DC

## 2021-11-08 ENCOUNTER — Ambulatory Visit (INDEPENDENT_AMBULATORY_CARE_PROVIDER_SITE_OTHER): Payer: 59 | Admitting: Licensed Practical Nurse

## 2021-11-08 ENCOUNTER — Encounter: Payer: Self-pay | Admitting: Emergency Medicine

## 2021-11-08 ENCOUNTER — Encounter: Payer: Self-pay | Admitting: Licensed Practical Nurse

## 2021-11-08 ENCOUNTER — Emergency Department: Payer: 59

## 2021-11-08 VITALS — BP 104/70 | Ht 62.0 in | Wt 131.0 lb

## 2021-11-08 DIAGNOSIS — R42 Dizziness and giddiness: Secondary | ICD-10-CM

## 2021-11-08 DIAGNOSIS — N938 Other specified abnormal uterine and vaginal bleeding: Secondary | ICD-10-CM | POA: Insufficient documentation

## 2021-11-08 DIAGNOSIS — N939 Abnormal uterine and vaginal bleeding, unspecified: Secondary | ICD-10-CM

## 2021-11-08 DIAGNOSIS — Z7951 Long term (current) use of inhaled steroids: Secondary | ICD-10-CM | POA: Insufficient documentation

## 2021-11-08 DIAGNOSIS — J45909 Unspecified asthma, uncomplicated: Secondary | ICD-10-CM | POA: Insufficient documentation

## 2021-11-08 LAB — COMPREHENSIVE METABOLIC PANEL
ALT: 11 U/L (ref 0–44)
AST: 20 U/L (ref 15–41)
Albumin: 4.4 g/dL (ref 3.5–5.0)
Alkaline Phosphatase: 66 U/L (ref 38–126)
Anion gap: 10 (ref 5–15)
BUN: 17 mg/dL (ref 6–20)
CO2: 22 mmol/L (ref 22–32)
Calcium: 9.2 mg/dL (ref 8.9–10.3)
Chloride: 107 mmol/L (ref 98–111)
Creatinine, Ser: 0.66 mg/dL (ref 0.44–1.00)
GFR, Estimated: >60 mL/min (ref >60)
Glucose, Bld: 95 mg/dL (ref 70–99)
Potassium: 4 mmol/L (ref 3.5–5.1)
Sodium: 139 mmol/L (ref 135–145)
Total Bilirubin: 0.6 mg/dL (ref 0.3–1.2)
Total Protein: 7.6 g/dL (ref 6.5–8.1)

## 2021-11-08 LAB — CBC
HCT: 40.6 % (ref 36.0–46.0)
Hemoglobin: 12.7 g/dL (ref 12.0–15.0)
MCH: 28.1 pg (ref 26.0–34.0)
MCHC: 31.3 g/dL (ref 30.0–36.0)
MCV: 89.8 fL (ref 80.0–100.0)
Platelets: 284 10*3/uL (ref 150–400)
RBC: 4.52 MIL/uL (ref 3.87–5.11)
RDW: 12 % (ref 11.5–15.5)
WBC: 7.9 10*3/uL (ref 4.0–10.5)
nRBC: 0 % (ref 0.0–0.2)

## 2021-11-08 LAB — TYPE AND SCREEN
ABO/RH(D): O POS
Antibody Screen: NEGATIVE

## 2021-11-08 NOTE — Progress Notes (Signed)
HPI: Here for evaluation for heavy  vaginal bleeding and feeling dizzy like she is going to faint.   SVB on 4/6.  Her postpartum bleeding stopped for about 1 week, then she started to bleed again, she will bleed for weeks than stop for a few days and then begin to bleed, her bleeding has been heavy and she has passed some clots. She has been feeling dizzy, like the room is spinning and like she may "pass out".  She saw Dr Logan Bores on 8/11, she reported the heavy bleeding then, was started on Iron and OCP's.  On 8/12 she felt dizzy and got the shakes, she was evaluated at [facility] they told she was "fine", her hgb was 12.4 and was given IV fluids and sent home.  Sherry Lutz was concerned that she was having a reaction to the OCP's and was told by the provider at [facility] to stop the OCP's,  so  she has not been taking them. Yesterday at work she got so dizzy, she nearly fainted. She has been bleeding today and feels dizzy and weak.   Sherry Lutz continues to have vaginal pain at the site of her repair, is leaning more closely at having a surgical intervention.   PMHx: She  has a past medical history of Asthma, Postpartum care following vaginal delivery (06/28/2021), and RSD (reflex sympathetic dystrophy). Also,  has a past surgical history that includes Intercostal nerve block., family history includes Healthy in her father and mother.,  reports that she has never smoked. She has never used smokeless tobacco. She reports that she does not drink alcohol and does not use drugs.  She  Current Outpatient Medications:    FEROSUL 325 (65 Fe) MG tablet, Take by mouth., Disp: , Rfl:    fluticasone (FLOVENT HFA) 110 MCG/ACT inhaler, Inhale into the lungs., Disp: , Rfl:    Norethindrone Acetate-Ethinyl Estrad-FE (LOESTRIN 24 FE) 1-20 MG-MCG(24) tablet, Take 1 tablet by mouth at bedtime. (Patient not taking: Reported on 11/08/2021), Disp: 84 tablet, Rfl: 0  Also, is allergic to pregabalin and red dye.  Review of  Systems  Constitutional:  Positive for malaise/fatigue.  Eyes: Negative.   Respiratory: Negative.    Cardiovascular:  Positive for palpitations.  Gastrointestinal:        Cramping   Neurological:  Positive for dizziness.    Objective: BP 104/70   Ht 5\' 2"  (1.575 m)   Wt 131 lb (59.4 kg)   LMP 10/22/2021 (Approximate)   Breastfeeding No   BMI 23.96 kg/m  Physical Exam Constitutional:      Appearance: She is ill-appearing.  Genitourinary:     Vulva normal.     Genitourinary Comments: Tender on inner left labia, able to introduce speculum SSE: cervix pink, no lesions, moderate amount of menses like discharge Bimanual exam: no CMT, uterus slightly bigger than non gravid size, some tenderness of abd with bimanual exam   HENT:     Head: Normocephalic.  Cardiovascular:     Rate and Rhythm: Normal rate and regular rhythm.     Heart sounds: Normal heart sounds.  Pulmonary:     Effort: Pulmonary effort is normal.     Breath sounds: Normal breath sounds.  Abdominal:     General: Abdomen is flat. There is no distension.     Tenderness: There is no abdominal tenderness.  Musculoskeletal:     Cervical back: Normal range of motion and neck supple.  Skin:    General: Skin is cool.  Coloration: Skin is pale.     Comments: Lips pink   Psychiatric:        Mood and Affect: Mood normal.     ASSESSMENT/PLAN:   abnormal uterine bleeding  Dizziness  Problem List Items Addressed This Visit   None  Reviewed heavy bleeding could be related to a thicken uterine lining or retained POC (even though placenta was intact and her PP exam at 6 wks was WNL), Rec pelvic US and hormonal treatment to stop the bleeding. Sherry Lutz's mother asked if she could get a stat US and a blood transfusion.  Reviewed parameters for a blood transfusion and a stat US could not be possible as it is currently Friday evening.  The dizziness is most likely related to the blood loss, but there could be something else  going on-rec seeing PCP.  Offered to order labs to be done tomorrow at [facility], [place] to be done next week, and start Provera now.  Both Sherry Lutz and her mother express concern for needing to wait and concern that her dizziness will get worse, weighed the pro's and cons of out patient management or going to [facility] ED for evaluation. Sherry Lutz prefers to go to the ED now. Anticipatory guidance given, aware she may require a D and C.    Paula Compton CNM on call aware of pt.   Will address vaginal pain at future appointments  Carie Caddy, CNM  [facility], [facility]  11/08/21  5:54 PM  '

## 2021-11-08 NOTE — ED Triage Notes (Signed)
Patient is 19 weeks post partum and has been having heavy bleeding since then. Patient has been feeling light headed and weakness. Has seen her OBGYN and sent here for [place].

## 2021-11-08 NOTE — ED Notes (Signed)
Patient transported to Ultrasound 

## 2021-11-09 ENCOUNTER — Emergency Department
Admission: EM | Admit: 2021-11-09 | Discharge: 2021-11-09 | Disposition: A | Discharge status: Home / Self Care | Payer: 59 | Attending: Emergency Medicine | Admitting: Emergency Medicine

## 2021-11-09 DIAGNOSIS — N938 Other specified abnormal uterine and vaginal bleeding: Secondary | ICD-10-CM

## 2021-11-09 DIAGNOSIS — R42 Dizziness and giddiness: Secondary | ICD-10-CM

## 2021-11-09 LAB — POC URINE PREG, ED: Preg Test, Ur: NEGATIVE

## 2021-11-09 MED ORDER — MEDROXYPROGESTERONE ACETATE 10 MG PO TABS: 10.0000 mg | ORAL_TABLET | Freq: Every day | ORAL | 0 refills | Status: DC

## 2021-11-09 MED ORDER — SODIUM CHLORIDE 0.9 % IV BOLUS
1000.0000 mL | Freq: Once | INTRAVENOUS | Status: AC
Start: 2021-11-09 — End: 2021-11-09
  Administered 2021-11-09: 1000 mL via INTRAVENOUS

## 2021-11-09 MED ORDER — FEROSUL 325 (65 FE) MG PO TABS
325.0000 mg | ORAL_TABLET | Freq: Two times a day (BID) | ORAL | 0 refills | Status: AC
Start: 2021-11-09 — End: ?

## 2021-11-09 NOTE — Discharge Instructions (Addendum)
Start Provera 10mg  daily x10 days.  You may restart iron tablets.  Return to the ER for worsening symptoms, passing out or other concerns.

## 2021-11-09 NOTE — ED Provider Notes (Signed)
[facility] Provider Note    Event Date/Time   First MD Initiated Contact with Patient 11/09/21 [phone]     (approximate)   History   Vaginal Bleeding   HPI  Sherry Lutz is a 26 y.o. female who presents to the ED with a chief complaint of vaginal bleeding.  Patient is 19 weeks postpartum and has been experiencing heavy bleeding since birth.  Has been passing clots and feeling lightheaded and generally weak.  She has been seen by her OB/GYN and tried on OCPs but patient stopped it because she thought she was having an allergic reaction.  She has also been seen at [facility] for same and felt better after IV fluids.  She was seen by the nurse midwife in the office yesterday afternoon and offered to have outpatient ultrasound as well as lab work.  Patient and her mother were concerned and wanted the studies done more quickly so they were referred to the ED.  Denies associated fever, cough, chest pain, shortness of breath, abdominal pain, nausea or vomiting.  Patient is not breast-feeding.     Past Medical History   Past Medical History:  Diagnosis Date   Asthma    Postpartum care following vaginal delivery 06/28/2021   RSD (reflex sympathetic dystrophy)    foot     Active Problem List   Patient Active Problem List   Diagnosis Date Noted   Encounter for care or examination of lactating mother 06/28/2021   Headache 06/12/2021   Complex regional pain syndrome I of other specified site 12/10/2020   Reactive airway disease 01/28/2019   RSD (reflex sympathetic dystrophy) 06/04/2011     Past Surgical History   Past Surgical History:  Procedure Laterality Date   INTERCOSTAL NERVE BLOCK       Home Medications   Prior to Admission medications   Medication Sig Start Date End Date Taking? Authorizing Provider  medroxyPROGESTERone (PROVERA) 10 MG tablet Take 1 tablet (10 mg total) by mouth daily. 11/09/21  Yes Irean Hong, MD  FEROSUL 325 (65  Fe) MG tablet Take 1 tablet (325 mg total) by mouth 2 (two) times daily with a meal. 11/09/21   Irean Hong, MD  fluticasone (FLOVENT HFA) 110 MCG/ACT inhaler Inhale into the lungs. 02/25/19 02/25/20  [provider]  Norethindrone Acetate-Ethinyl Estrad-FE (LOESTRIN 24 FE) 1-20 MG-MCG(24) tablet Take 1 tablet by mouth at bedtime. Patient not taking: Reported on 11/08/2021 11/04/21 01/27/22  Linzie Collin, MD     Allergies  Pregabalin and Red dye   Family History   Family History  Problem Relation Age of Onset   Healthy Mother    Healthy Father      Physical Exam  Triage Vital Signs: ED Triage Vitals  Enc Vitals Group     BP 11/08/21 1841 (!) 133/93     Pulse Rate 11/08/21 1841 67     Resp 11/08/21 1841 17     Temp 11/08/21 1841 98.1 F (36.7 C)     Temp Source 11/08/21 1841 Oral     SpO2 11/08/21 1841 100 %     Weight --      Height --      Head Circumference --      Peak Flow --      Pain Score 11/08/21 1846 2     Pain Loc --      Pain Edu? --      Excl. in GC? --  Updated Vital Signs: BP 130/86   Pulse 66   Temp 97.7 F (36.5 C) (Oral)   Resp 18   LMP 10/22/2021 (Approximate)   SpO2 100%    General: Awake, no distress.  CV:  RRR.  Good peripheral perfusion.  Resp:  Normal effort.  CTAB. Abd:  Nontender.  No distention.  Other:  Pelvic exam deferred as patient just had one in the office yesterday afternoon and denies change in her symptoms.   ED Results / Procedures / Treatments  Labs (all labs ordered are listed, but only abnormal results are displayed) Labs Reviewed  CBC  COMPREHENSIVE METABOLIC PANEL  POC URINE PREG, ED  TYPE AND SCREEN     EKG  None   RADIOLOGY I have independently visualized and interpreted patient's ultrasound as well as noted the radiology interpretation:  Ultrasound: No significant abnormalities; 2.5 cm right ovarian cyst  Official radiology report(s): US PELVIC COMPLETE WITH TRANSVAGINAL  Result  Date: 11/08/2021 CLINICAL DATA:  Vaginal bleeding EXAM: TRANSABDOMINAL AND TRANSVAGINAL ULTRASOUND OF PELVIS TECHNIQUE: Both transabdominal and transvaginal ultrasound examinations of the pelvis were performed. Transabdominal technique was performed for global imaging of the pelvis including uterus, ovaries, adnexal regions, and pelvic cul-de-sac. It was necessary to proceed with endovaginal exam following the transabdominal exam to visualize the endometrium and ovaries. COMPARISON:  None Available. FINDINGS: Uterus Measurements: 6.4 x 3.9 x 5 cm = volume: 65 mL. No fibroids or other mass visualized. Endometrium Thickness: 3 mm.  No focal abnormality visualized. Right ovary Measurements: 4.2 x 2.9 x 3.3 cm = volume: 20.9 mL. There is 2.5 x 2.2 cm anechoic structure, possibly dominant follicle or functional cyst.r Left ovary Measurements: 3 x 2.7 x 1.8 cm = volume: 7.7 mL. Normal appearance/no adnexal mass. Other findings No abnormal free fluid. IMPRESSION: No significant sonographic abnormalities are seen in pelvis. 2.5 cm cyst in the right adnexa most likely is a dominant follicle or functional cyst. No follow-up is recommended. Electronically Signed   By: Ernie Avena M.D.   On: 11/08/2021 19:45     PROCEDURES:  Critical Care performed: No  Procedures   MEDICATIONS ORDERED IN ED: Medications  sodium chloride 0.9 % bolus 1,000 mL (1,000 mLs Intravenous New Bag/Given 11/09/21 0236)     IMPRESSION / MDM / ASSESSMENT AND PLAN / ED COURSE  I reviewed the triage vital signs and the nursing notes.                             26 year old female presenting with prolonged postpartum vaginal bleeding with generalized weakness and lightheadedness.  Differential diagnosis includes but is not limited to functional uterine bleeding, retained products of conception, POTS, etc.  I have personally reviewed patient's records and note her most recent office visit from 11/08/2021.  I have also reviewed  patient's lab work and noted she was anemic 4 months ago.  Patient tells me she was placed on 2 weeks of iron and her H/H normalized and has been stable since that time.  Patient's presentation is most consistent with acute presentation with potential threat to life or bodily function.  H/H12.7/40.6.  Pelvic ultrasound unremarkable.  Will obtain orthostatic vital signs, likely administer IV hydration.  Will discuss with GYN on-call.  Clinical Course as of 11/09/21 0315  Sat Nov 09, 2021  0148 Spoke with Paula Compton, midwife on-call for [facility].  Recommends proceeding with Provera and will see patient in the office  next week. [JS]  0220 Patient not orthostatic but felt too dizzy to remain standing for 3 minutes.  Will initiate IV hydration. [JS]  0310 IV fluids completed.  Patient feeling better.  Long discussion with patient and her mother; will refer her to cardiology for evaluation of POTS or other cause for her dizziness and weakness.  In the meantime she will start Provera 10-day course.  She will also restart iron tablets.  Patient asked me to refill the prescription for iron tablets that she was given and took shortly after delivery.  Advised patient to not be holding the baby when she gets up or changes positions and to do so slowly.  Advised her to eat small meals frequently and to keep herself well-hydrated.  She will follow-up closely with GYN.  Strict return precautions given.  Both verbalized understanding agree with plan of care. [JS]    Clinical Course User Index [JS] Irean Hong, MD     FINAL CLINICAL IMPRESSION(S) / ED DIAGNOSES   Final diagnoses:  Dysfunctional uterine bleeding  Dizziness     Rx / DC Orders   ED Discharge Orders          Ordered    medroxyPROGESTERone (PROVERA) 10 MG tablet  Daily        11/09/21 0206    FEROSUL 325 (65 Fe) MG tablet  2 times daily with meals        11/09/21 0312    Ambulatory referral to Cardiology       Comments: If you  have not heard from the Cardiology office within the next 72 hours please call [phone].   11/09/21 0313             Note:  This document was prepared using Dragon voice recognition software and may include unintentional dictation errors.   Irean Hong, MD 11/09/21 0800

## 2021-11-14 NOTE — Progress Notes (Deleted)
   New Outpatient Visit Date: 11/15/2021  Referring Provider: Irean Hong, MD [address] [place],  [place] [address]  Chief Complaint: ***  HPI:  Sherry Lutz is a 26 y.o. female who is being seen today for the evaluation of dizziness in the setting of postpartum vaginal bleeding at the request of Dr. Dolores Frame. She has a history of ***. ***  Question POTS  --------------------------------------------------------------------------------------------------  Cardiovascular History & Procedures: Cardiovascular Problems: Dizziness  Risk Factors: None  Cath/PCI: None  CV Surgery: None  EP Procedures and Devices: None  Non-Invasive Evaluation(s): None  Recent CV Pertinent Labs: Lab Results  Component Value Date   K 4.0 11/08/2021   BUN 17 11/08/2021   BUN 3 (L) 06/18/2021   CREATININE 0.66 11/08/2021    --------------------------------------------------------------------------------------------------  Past Medical History:  Diagnosis Date   Asthma    Postpartum care following vaginal delivery 06/28/2021   RSD (reflex sympathetic dystrophy)    foot    Past Surgical History:  Procedure Laterality Date   INTERCOSTAL NERVE BLOCK      No outpatient medications have been marked as taking for the 11/15/21 encounter (Appointment) with Jasilyn Holderman, Cristal Deer, MD.    Allergies: Pregabalin and Red dye  Social History   Tobacco Use   Smoking status: Never   Smokeless tobacco: Never  Vaping Use   Vaping Use: Never used  Substance Use Topics   Alcohol use: No   Drug use: No    Family History  Problem Relation Age of Onset   Healthy Mother    Healthy Father     Review of Systems: A 12-system review of systems was performed and was negative except as noted in the HPI.  --------------------------------------------------------------------------------------------------  Physical Exam: LMP 10/22/2021 (Approximate)   General:  *** HEENT: No conjunctival pallor or  scleral icterus. Facemask in place. Neck: Supple without lymphadenopathy, thyromegaly, JVD, or HJR. No carotid bruit. Lungs: Normal work of breathing. Clear to auscultation bilaterally without wheezes or crackles. Heart: Regular rate and rhythm without murmurs, rubs, or gallops. Non-displaced PMI. Abd: Bowel sounds present. Soft, NT/ND without hepatosplenomegaly Ext: No lower extremity edema. Radial, PT, and DP pulses are 2+ bilaterally Skin: Warm and dry without rash. Neuro: CNIII-XII intact. Strength and fine-touch sensation intact in upper and lower extremities bilaterally. Psych: Normal mood and affect.  EKG:  ***  Lab Results  Component Value Date   WBC 7.9 11/08/2021   HGB 12.7 11/08/2021   HCT 40.6 11/08/2021   MCV 89.8 11/08/2021   PLT 284 11/08/2021    Lab Results  Component Value Date   NA 139 11/08/2021   K 4.0 11/08/2021   CL 107 11/08/2021   CO2 22 11/08/2021   BUN 17 11/08/2021   CREATININE 0.66 11/08/2021   GLUCOSE 95 11/08/2021   ALT 11 11/08/2021    No results found for: "CHOL", "HDL", "LDLCALC", "LDLDIRECT", "TRIG", "CHOLHDL"   --------------------------------------------------------------------------------------------------  ASSESSMENT AND PLAN: ***  [name], MD 11/14/2021 7:32 AM

## 2021-11-15 ENCOUNTER — Encounter: Payer: Self-pay | Admitting: Licensed Practical Nurse

## 2021-11-15 ENCOUNTER — Ambulatory Visit: Payer: 59 | Admitting: Internal Medicine

## 2021-11-21 ENCOUNTER — Encounter: Payer: Self-pay | Admitting: Internal Medicine

## 2021-11-21 ENCOUNTER — Ambulatory Visit: Payer: 59 | Attending: Cardiovascular Disease | Admitting: Internal Medicine

## 2021-11-21 ENCOUNTER — Other Ambulatory Visit
Admission: RE | Admit: 2021-11-21 | Discharge: 2021-11-21 | Disposition: A | Discharge status: Home / Self Care | Payer: 59 | Source: Ambulatory Visit | Attending: Internal Medicine | Admitting: Internal Medicine

## 2021-11-21 VITALS — BP 120/72 | HR 87 | Ht 62.0 in | Wt 129.4 lb

## 2021-11-21 DIAGNOSIS — I951 Orthostatic hypotension: Secondary | ICD-10-CM

## 2021-11-21 DIAGNOSIS — R000 Tachycardia, unspecified: Secondary | ICD-10-CM

## 2021-11-21 DIAGNOSIS — R55 Syncope and collapse: Secondary | ICD-10-CM | POA: Insufficient documentation

## 2021-11-21 DIAGNOSIS — R42 Dizziness and giddiness: Secondary | ICD-10-CM | POA: Diagnosis not present

## 2021-11-21 LAB — CORTISOL: Cortisol, Plasma: 6.9 ug/dL

## 2021-11-21 LAB — TSH: TSH: 0.814 u[IU]/mL (ref 0.350–4.500)

## 2021-11-21 LAB — T4, FREE: Free T4: 0.79 ng/dL (ref 0.61–1.12)

## 2021-11-21 MED ORDER — MECLIZINE HCL 25 MG PO TABS: 25.0000 mg | ORAL_TABLET | Freq: Four times a day (QID) | ORAL | 1 refills | Status: AC | PRN

## 2021-11-21 NOTE — Patient Instructions (Signed)
Medication Instructions:   Your physician has recommended you make the following change in your medication:   START Meclizine 25 mg - take 1 tablet every 6 hours AS NEEDED for dizziness  *If you need a refill on your cardiac medications before your next appointment, please call your pharmacy*   Lab Work:  Today at the medical mall: TSH, Free T4, Cortisol  -  Please go to the Medical Mall Entrance at [facility] -  Check in at the Registration Desk: 1st desk to the right, past the screening table   If you have labs (blood work) drawn today and your tests are completely normal, you will receive your results only by: MyChart Message (if you have MyChart) OR A paper copy in the mail If you have any lab test that is abnormal or we need to change your treatment, we will call you to review the results.   Testing/Procedures:  Your physician has requested that you have an echocardiogram (NEXT 1-2 WEEKS). Echocardiography is a painless test that uses sound waves to create images of your heart. It provides your doctor with information about the size and shape of your heart and how well your heart's chambers and valves are working. This procedure takes approximately one hour. There are no restrictions for this procedure.   Follow-Up: At [facility], you and your health needs are our priority.  As part of our continuing mission to provide you with exceptional heart care, we have created designated Provider Care Teams.  These Care Teams include your primary Cardiologist (physician) and Advanced Practice Providers (APPs -  Physician Assistants and Nurse Practitioners) who all work together to provide you with the care you need, when you need it.  We recommend signing up for the patient portal called "MyChart".  Sign up information is provided on this After Visit Summary.  MyChart is used to connect with patients for Virtual Visits (Telemedicine).  Patients are able to view lab/test results,  encounter notes, upcoming appointments, etc.  Non-urgent messages can be sent to your provider as well.   To learn more about what you can do with MyChart, go to [email or web address].    Your next appointment:   1 month(s)  The format for your next appointment:   In Person  Provider:   You may see Dr. Cristal Deer End or one of the following Advanced Practice Providers on your designated Care Team:   Nicolasa Ducking, NP Eula Listen, PA-C Cadence Fransico Michael, PA-C Charlsie Quest, NP    Other Instructions  Drink plenty of fluids  You may also try wearing "spanx" to help with dizziness   Important Information About Sugar

## 2021-11-21 NOTE — Progress Notes (Signed)
New Outpatient Visit Date: 11/21/2021  Referring Provider: Irean Hong, MD [address] [place],  [place] [address]  Chief Complaint: Dizziness  HPI:  Sherry Lutz is a 26 y.o. female who is being seen today for the evaluation of dizziness in the setting of postpartum vaginal bleeding at the request of Dr. Dolores Frame. 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  She had been doing well until March of this year.  She developed headaches and was noted to have preeclampsia, prompting induction of labor 3 weeks early.  Her delivery was complicated by perineal laceration and significant blood loss.  She reports that she was hypotensive after delivery and briefly passed out.  Since delivery, she has continued to have fairly significant vaginal bleeding.  She was started on Loestrin earlier this month due to vaginal bleeding.  After taking the first dose she developed marked dizziness.  She felt like the room was spinning around her with associated nausea and chest pain.  She then felt like she might pass out.  Despite not taking any more Loestrin, these episodes have been happening almost daily.  She has been evaluated in the ED at [facility] and [facility]; she was advised that dehydration may be contributing.  Hemoglobin has been normal on checks earlier this month.  D-dimer was also negative at [facility] on 11/03/2021.  She is concerned that her symptoms may represent POTS.  Sherry Lutz denies a history of prior heart disease.  Her older sister has transposition of the great arteries.  She underwent ED evaluation couple of years ago due to chest pain and was told that the pain was due to costochondritis.  Echocardiogram at that time was normal.  She still has some intermittent sharp central chest pains that are reproducible with pressure on the sternum.  She works at a [employer] and is concerned because she often feels like she may pass out when working  with patients.  The only episode of loss of consciousness that she reports was at the time of her delivery.  She notes occasional palpitations and racing of her heart.  She is not breast-feeding.  Shortly before delivery, she underwent brain MRI for evaluation of headaches in the setting of preeclampsia.  Study was unremarkable other enlargement of the pituitary gland thought to be due to her gravid state.  --------------------------------------------------------------------------------------------------  Cardiovascular History & Procedures: Cardiovascular Problems: Dizziness  Risk Factors: None  Cath/PCI: None  CV Surgery: None  EP Procedures and Devices: None  Non-Invasive Evaluation(s): TTE (09/03/2017, [facility]): Normal biventricular systolic function.  No significant valvular abnormality.  Recent CV Pertinent Labs: Lab Results  Component Value Date   K 4.0 11/08/2021   BUN 17 11/08/2021   BUN 3 (L) 06/18/2021   CREATININE 0.66 11/08/2021    --------------------------------------------------------------------------------------------------  Past Medical History:  Diagnosis Date   Asthma    Postpartum care following vaginal delivery 06/28/2021   RSD (reflex sympathetic dystrophy)    foot    Past Surgical History:  Procedure Laterality Date   INTERCOSTAL NERVE BLOCK      Current Meds  Medication Sig   FEROSUL 325 (65 Fe) MG tablet Take 1 tablet (325 mg total) by mouth 2 (two) times daily with a meal.   fluticasone (FLOVENT HFA) 110 MCG/ACT inhaler Inhale into the lungs.   [DISCONTINUED] medroxyPROGESTERone (PROVERA) 10 MG tablet Take 1 tablet (10 mg total) by mouth daily.    Allergies: Pregabalin, Norethin  ace-eth estrad-fe, Levonorgest-eth estrad 91-day, and Red dye  Social History   Tobacco Use   Smoking status: Never   Smokeless tobacco: Never  Vaping Use   Vaping Use: Never used  Substance Use Topics   Alcohol use: No   Drug use: No    Family History   Problem Relation Age of Onset   Healthy Mother    Healthy Father    Heart disease Sister        transposition of great vessels   Supraventricular tachycardia Maternal Grandmother    Coronary artery disease Maternal Grandfather        CABG at age 88    Review of Systems: A 12-system review of systems was performed and was negative except as noted in the HPI.  --------------------------------------------------------------------------------------------------  Physical Exam: BP 120/72 (BP Location: Right Arm, Patient Position: Sitting, Cuff Size: Normal)   Pulse 87   Ht 5\' 2"  (1.575 m)   Wt 129 lb 6 oz (58.7 kg)   LMP 10/22/2021 (Approximate)   SpO2 99%   BMI 23.66 kg/m  Position Blood pressure (mmHg) Heart rate (bpm)  Lying 131/85 83  Sitting 129/85 92  Standing 144/87 111  Standing (3 minutes) 126/82 123   General: NAD.  Accompanied by her mother. HEENT: No conjunctival pallor or scleral icterus. Facemask in place. Neck: Supple without lymphadenopathy, thyromegaly, JVD, or HJR. No carotid bruit. Lungs: Normal work of breathing. Clear to auscultation bilaterally without wheezes or crackles. Heart: Tachycardic but regular without murmurs, rubs, or gallops. Non-displaced PMI. Abd: Bowel sounds present. Soft, NT/ND without hepatosplenomegaly Ext: No lower extremity edema. Radial, PT, and DP pulses are 2+ bilaterally Skin: Warm and dry without rash. Neuro: CNIII-XII intact. Strength and fine-touch sensation intact in upper and lower extremities bilaterally.  Dizziness reproduced with evaluation of extraocular eye movements. Psych: Normal mood and affect.  EKG: Normal sinus rhythm with sinus arrhythmia and borderline left atrial enlargement.  No significant change from prior tracing on 04/02/2017.  Lab Results  Component Value Date   WBC 7.9 11/08/2021   HGB 12.7 11/08/2021   HCT 40.6 11/08/2021   MCV 89.8 11/08/2021   PLT 284 11/08/2021    Lab Results  Component Value  Date   NA 139 11/08/2021   K 4.0 11/08/2021   CL 107 11/08/2021   CO2 22 11/08/2021   BUN 17 11/08/2021   CREATININE 0.66 11/08/2021   GLUCOSE 95 11/08/2021   ALT 11 11/08/2021    --------------------------------------------------------------------------------------------------  ASSESSMENT AND PLAN: Dizziness, tachycardia, and orthostatic hypotension: Symptoms began in the postpartum period, particularly around the time that Sherry Lutz was started on Loestrin.  Despite only having taken 1 dose, her symptoms have persisted over the last few weeks.  Her physical examination today is notable for a significant rise in heart rate from lying to standing, particularly after 3 minutes.  Borderline significant blood pressure drop also occurred when standing for 3 minutes.  EKG and physical exam are otherwise unremarkable other than reproducible vertigo with assessment of extraocular eye movements.  Her description of things spinning around her are most suggestive of vertigo and not of a cardiac cause for her dizziness.  Nonetheless, I think it would be prudent to repeat an echocardiogram given the relatively sudden onset of symptoms over the last few weeks.  I will also check a TSH, free T4 and cortisol level to screen for evidence of pituitary failure, which could have been precipitated by her hypotension/syncope during her complicated delivery this  spring.  If this work-up is unremarkable, I would recommend consultation with ENT +/- repeating brain MRI.  I have encouraged Sherry Lutz to stay well-hydrated and to consider wearing compressive garments such as Spanks.  Consultation with Dr. Graciela Husbands will need to be considered if we do not uncover any other cause for her symptoms.  I will provide her with a prescription for meclizine to be taken as needed to help with her dizziness.  Follow-up: Return to clinic in 1 month.  [name], MD 11/21/2021 4:14 PM

## 2021-11-22 ENCOUNTER — Telehealth: Payer: Self-pay | Admitting: Internal Medicine

## 2021-11-22 ENCOUNTER — Encounter: Payer: Self-pay | Admitting: Internal Medicine

## 2021-11-22 DIAGNOSIS — I951 Orthostatic hypotension: Secondary | ICD-10-CM | POA: Insufficient documentation

## 2021-11-22 DIAGNOSIS — R000 Tachycardia, unspecified: Secondary | ICD-10-CM | POA: Insufficient documentation

## 2021-11-22 DIAGNOSIS — R42 Dizziness and giddiness: Secondary | ICD-10-CM | POA: Insufficient documentation

## 2021-11-22 NOTE — Telephone Encounter (Signed)
Sent MyChart message asking pt if she needs a work note.

## 2021-11-22 NOTE — Telephone Encounter (Signed)
Called patient to schedule ECHO at hospital Patient would like a note for appointment yesterday  Ok to send through [employer]

## 2021-11-26 ENCOUNTER — Encounter: Payer: Self-pay | Admitting: Internal Medicine

## 2021-11-26 NOTE — Telephone Encounter (Signed)
If her symptoms are not improving and they are predominantly driven by tachycardia, we could try metoprolol tartrate 12.5 twice daily.  She should be mindful that the medication can lower her blood pressure a little.  [name], MD [facility]

## 2021-11-27 MED ORDER — METOPROLOL TARTRATE 25 MG PO TABS
12.5000 mg | ORAL_TABLET | Freq: Two times a day (BID) | ORAL | 0 refills | Status: AC
Start: 2021-11-27 — End: ?

## 2021-11-27 NOTE — Telephone Encounter (Signed)
Pt c/o medication issue:  1. Name of Medication: meclizine (ANTIVERT) 25 MG tablet  2. How are you currently taking this medication (dosage and times per day)? As prescribed   3. Are you having a reaction (difficulty breathing--STAT)? No   4. What is your medication issue? Patient is calling wanting to know if she should continue taking meclizine since being prescribed metoprolol. Please advise.

## 2021-11-27 NOTE — Telephone Encounter (Signed)
Spoke with pt. Notified of Dr. Serita Kyle recc below. Pt very appreciative and voiced understanding.  Pt will start metoprolol tartrate 12.5 mg twice daily and will monitor s/s and BP. She will let us know of any issues after starting medication.  Pt states that she is currently at [facility] with family. She has been staying indoors the last couple days due to symptoms.  Pt asks that we send Rx to [facility] [address] [place], [place] [address].  Rx sent for #30 with no refills. Notified pt to let us know on refill request to change pharmacy.   Pt has no further questions at this time.

## 2021-11-29 ENCOUNTER — Telehealth: Payer: Self-pay | Admitting: Internal Medicine

## 2021-11-29 NOTE — Telephone Encounter (Signed)
Spoke with pt. See also mychart encounter 11/26/21.   Pt states they are on the way [place] from the beach now and has not had access to a BP cuff to verify blood pressure.  However, she has been taking the metoprolol tartrate 12.5 mg twice daily since Wednesday of this week, and has been feeling light headed about an hour after taking. Pt is however, very pleased that her HR has no been doing well and metoprolol has resolved her fast HR since starting. Pt also reports she does not want to take Meclizine any longer as it seems to have made her feel worse after taking as needed.   Pt states she would like to try taking metoprolol dose only at night or trying to take 1/4 tablet for 6.25 mg twice daily.  Pt states when she arrives home she will begin checking her BP as well and will send [place] readings to review.   Pt will continue to monitor symptoms and let us know of any worsening s/s.

## 2021-11-29 NOTE — Telephone Encounter (Signed)
  Pt c/o medication issue:  1. Name of Medication:   metoprolol tartrate (LOPRESSOR) 25 MG tablet    2. How are you currently taking this medication (dosage and times per day)? Take 0.5 tablets (12.5 mg total) by mouth 2 (two) times daily.  3. Are you having a reaction (difficulty breathing--STAT)? No   4. What is your medication issue? Pt is calling back, she said, she just have couple of question about this medication

## 2021-12-01 ENCOUNTER — Encounter: Payer: Self-pay | Admitting: Internal Medicine

## 2021-12-03 ENCOUNTER — Ambulatory Visit: Payer: 59

## 2021-12-05 ENCOUNTER — Other Ambulatory Visit: Payer: Self-pay | Admitting: *Deleted

## 2021-12-05 DIAGNOSIS — I951 Orthostatic hypotension: Secondary | ICD-10-CM

## 2021-12-05 DIAGNOSIS — R000 Tachycardia, unspecified: Secondary | ICD-10-CM

## 2021-12-05 DIAGNOSIS — R42 Dizziness and giddiness: Secondary | ICD-10-CM

## 2021-12-06 ENCOUNTER — Ambulatory Visit
Admission: RE | Admit: 2021-12-06 | Discharge: 2021-12-06 | Disposition: A | Discharge status: Home / Self Care | Payer: 59 | Source: Ambulatory Visit | Attending: Internal Medicine | Admitting: Internal Medicine

## 2021-12-06 DIAGNOSIS — R42 Dizziness and giddiness: Secondary | ICD-10-CM | POA: Insufficient documentation

## 2021-12-06 DIAGNOSIS — R000 Tachycardia, unspecified: Secondary | ICD-10-CM | POA: Diagnosis not present

## 2021-12-06 DIAGNOSIS — I959 Hypotension, unspecified: Secondary | ICD-10-CM | POA: Diagnosis not present

## 2021-12-06 DIAGNOSIS — I951 Orthostatic hypotension: Secondary | ICD-10-CM | POA: Diagnosis not present

## 2021-12-06 LAB — ECHOCARDIOGRAM COMPLETE
AR max vel: 2.35 cm2
AV Area VTI: 2.46 cm2
AV Area mean vel: 2.15 cm2
AV Mean grad: 5 mmHg
AV Peak grad: 8.5 mmHg
Ao pk vel: 1.46 m/s
Area-P 1/2: 3.83 cm2
S' Lateral: 3.1 cm

## 2021-12-06 NOTE — Progress Notes (Signed)
*  PRELIMINARY RESULTS* Echocardiogram 2D Echocardiogram has been performed.  Sherry Lutz 12/06/2021, 12:58 PM

## 2021-12-23 ENCOUNTER — Ambulatory Visit: Payer: 59 | Admitting: Physician Assistant

## 2021-12-24 ENCOUNTER — Telehealth: Payer: Self-pay | Admitting: Nurse Practitioner

## 2021-12-24 ENCOUNTER — Other Ambulatory Visit: Payer: Self-pay | Admitting: Internal Medicine

## 2021-12-24 NOTE — Telephone Encounter (Signed)
LVM to schedule sooner appointment with Murray Hodgkins, NP

## 2022-01-13 ENCOUNTER — Ambulatory Visit: Payer: 59 | Admitting: Cardiovascular Disease

## 2022-01-17 ENCOUNTER — Ambulatory Visit: Payer: 59 | Admitting: Nurse Practitioner

## 2022-01-26 ENCOUNTER — Other Ambulatory Visit: Payer: Self-pay

## 2022-01-29 ENCOUNTER — Other Ambulatory Visit: Payer: Self-pay

## 2022-02-28 ENCOUNTER — Ambulatory Visit
Admission: EM | Admit: 2022-02-28 | Discharge: 2022-02-28 | Disposition: A | Discharge status: Home / Self Care | Payer: 59 | Attending: Emergency Medicine | Admitting: Emergency Medicine

## 2022-02-28 DIAGNOSIS — J0100 Acute maxillary sinusitis, unspecified: Secondary | ICD-10-CM | POA: Insufficient documentation

## 2022-02-28 LAB — GROUP A STREP BY PCR: Group A Strep by PCR: NOT DETECTED

## 2022-02-28 MED ORDER — IPRATROPIUM BROMIDE 0.03 % NA SOLN: 2.0000 | Freq: Two times a day (BID) | NASAL | 12 refills | Status: DC

## 2022-02-28 MED ORDER — FLUTICASONE PROPIONATE 50 MCG/ACT NA SUSP: 1.0000 | Freq: Every day | NASAL | 0 refills | Status: DC

## 2022-02-28 MED ORDER — AZITHROMYCIN 250 MG PO TABS: 250.0000 mg | ORAL_TABLET | Freq: Every day | ORAL | 0 refills | Status: DC

## 2022-02-28 NOTE — Discharge Instructions (Signed)
Today you are being treated for sinus infection which is most likely caused by a virus however if your symptoms show no signs of improvement by Tuesday there will be an antibiotic at the pharmacy for you to cover for bacteria, take azithromycin as directed  Strep test is negative  In the meantime we will help to reduce sinus congestion and pressure  Use Flonase every morning  Use ipratropium every morning and every evening  Purchase over-the-counter Mucinex which will help to thin secretions allowing them to drain from your sinuses    You can take Tylenol and/or Ibuprofen as needed for fever reduction and pain relief.   For cough: honey 1/2 to 1 teaspoon (you can dilute the honey in water or another fluid).  You can also use guaifenesin and dextromethorphan for cough. You can use a humidifier for chest congestion and cough.  If you don't have a humidifier, you can sit in the bathroom with the hot shower running.      For sore throat: try warm salt water gargles, cepacol lozenges, throat spray, warm tea or water with lemon/honey, popsicles or ice, or OTC cold relief medicine for throat discomfort.   For congestion: take a daily anti-histamine like Zyrtec, Claritin, and a oral decongestant, such as pseudoephedrine.  You can also use Flonase 1-2 sprays in each nostril daily.   It is important to stay hydrated: drink plenty of fluids (water, gatorade/powerade/pedialyte, juices, or teas) to keep your throat moisturized and help further relieve irritation/discomfort.

## 2022-02-28 NOTE — ED Provider Notes (Signed)
[facility]    CSN: [id number] Arrival date & time: 02/28/22  0815      History   Chief Complaint No chief complaint on file.   HPI ATLANTA JANES is a 26 y.o. female.   Patient presents with nasal congestion, rhinorrhea, bilateral ear pain and pressure, sinus pain and pressure along the jawline and cheeks and a sore throat for 3 days.  Decreased appetite, tolerating fluids.  Known exposure to RSV and strep within the household.  History of POTS, asthma, reactive airway..  Denies fever, chills, body aches, cough, shortness of breath or wheezing.    Past Medical History:  Diagnosis Date   Asthma    Postpartum care following vaginal delivery 06/28/2021   POTS (postural orthostatic tachycardia syndrome)    RSD (reflex sympathetic dystrophy)    foot    Patient Active Problem List   Diagnosis Date Noted   Dizziness 11/22/2021   Tachycardia 11/22/2021   Orthostatic hypotension 11/22/2021   Encounter for care or examination of lactating mother 06/28/2021   Headache 06/12/2021   Complex regional pain syndrome I of other specified site 12/10/2020   Reactive airway disease 01/28/2019   RSD (reflex sympathetic dystrophy) 06/04/2011    Past Surgical History:  Procedure Laterality Date   INTERCOSTAL NERVE BLOCK      OB History     Gravida  1   Para  1   Term  1   Preterm      AB      Living  1      SAB      IAB      Ectopic      Multiple  0   Live Births  1            Home Medications    Prior to Admission medications   Medication Sig Start Date End Date Taking? Authorizing Provider  FEROSUL 325 (65 Fe) MG tablet Take 1 tablet (325 mg total) by mouth 2 (two) times daily with a meal. 11/09/21   Paulette Blanch, MD  fluticasone (FLOVENT HFA) 110 MCG/ACT inhaler Inhale into the lungs. 02/25/19 11/21/21  [provider]  JUNEL FE 24 1-20 MG-MCG(24) tablet TAKE 1 TABLET BY MOUTH AT BEDTIME 01/31/22   Rubie Maid, MD  meclizine  (ANTIVERT) 25 MG tablet Take 1 tablet (25 mg total) by mouth every 6 (six) hours as needed for dizziness. 11/21/21   End, Harrell Gave, MD  metoprolol tartrate (LOPRESSOR) 25 MG tablet Take 0.5 tablets (12.5 mg total) by mouth 2 (two) times daily. 11/27/21   End, Harrell Gave, MD  midodrine (PROAMATINE) 5 MG tablet Take 10 mg by mouth 3 (three) times daily.    [provider]  norethindrone (MICRONOR) 0.35 MG tablet Take 1 tablet by mouth daily.    [provider]    Family History Family History  Problem Relation Age of Onset   Healthy Mother    Healthy Father    Heart disease Sister        transposition of great vessels   Supraventricular tachycardia Maternal Grandmother    Coronary artery disease Maternal Grandfather        CABG at age 48    Social History Social History   Tobacco Use   Smoking status: Never   Smokeless tobacco: Never  Vaping Use   Vaping Use: Never used  Substance Use Topics   Alcohol use: No   Drug use: No     Allergies  Pregabalin, Norethin ace-eth estrad-fe, Levonorgest-eth estrad 91-day, and Red dye   Review of Systems Review of Systems  Constitutional: Negative.   HENT:  Positive for congestion, ear pain, rhinorrhea, sinus pressure, sinus pain and sore throat. Negative for dental problem, drooling, ear discharge, facial swelling, hearing loss, mouth sores, nosebleeds, postnasal drip, sneezing, tinnitus, trouble swallowing and voice change.   Respiratory:  Negative for apnea, cough, choking, chest tightness, shortness of breath, wheezing and stridor.   Cardiovascular: Negative.   Skin: Negative.      Physical Exam Triage Vital Signs ED Triage Vitals  Enc Vitals Group     BP 02/28/22 0824 (!) 147/95     Pulse Rate 02/28/22 0824 (!) 108     Resp 02/28/22 0824 18     Temp 02/28/22 0824 98.8 F (37.1 C)     Temp Source 02/28/22 0824 Oral     SpO2 02/28/22 0824 99 %     Weight --      Height --      Head Circumference --       Peak Flow --      Pain Score 02/28/22 0828 7     Pain Loc --      Pain Edu? --      Excl. in [place]? --    No data found.  Updated Vital Signs BP (!) 147/95 (BP Location: Right Arm)   Pulse (!) 108   Temp 98.8 F (37.1 C) (Oral)   Resp 18   SpO2 99%   Visual Acuity Right Eye Distance:   Left Eye Distance:   Bilateral Distance:    Right Eye Near:   Left Eye Near:    Bilateral Near:     Physical Exam Constitutional:      Appearance: Normal appearance.  HENT:     Head: Normocephalic.     Right Ear: Tympanic membrane, ear canal and external ear normal.     Left Ear: Tympanic membrane, ear canal and external ear normal.     Nose: Congestion and rhinorrhea present.     Right Sinus: Maxillary sinus tenderness present. No frontal sinus tenderness.     Left Sinus: Maxillary sinus tenderness present. No frontal sinus tenderness.     Mouth/Throat:     Mouth: Mucous membranes are moist.     Pharynx: No posterior oropharyngeal erythema.  Eyes:     Extraocular Movements: Extraocular movements intact.  Cardiovascular:     Rate and Rhythm: Normal rate and regular rhythm.     Pulses: Normal pulses.     Heart sounds: Normal heart sounds.  Pulmonary:     Effort: Pulmonary effort is normal.     Breath sounds: Normal breath sounds.  Musculoskeletal:     Cervical back: Normal range of motion and neck supple.  Skin:    General: Skin is warm and dry.  Neurological:     Mental Status: She is alert and oriented to person, place, and time. Mental status is at baseline.  Psychiatric:        Mood and Affect: Mood normal.        Behavior: Behavior normal.      UC Treatments / Results  Labs (all labs ordered are listed, but only abnormal results are displayed) Labs Reviewed  GROUP A STREP BY PCR    EKG   Radiology No results found.  Procedures Procedures (including critical care time)  Medications Ordered in UC Medications - No data to display  Initial Impression /  Assessment and Plan / UC Course  I have reviewed the triage vital signs and the nursing notes.  Pertinent labs & imaging results that were available during my care of the patient were reviewed by me and considered in my medical decision making (see chart for details).  Acute nonrecurrent maxillary sinusitis  Etiology is most likely viral, strep PCR negative, discussed with patient, vitals are stable and she is nontoxic appearing nor in signs of distress, prescribed Flonase and ipratropium nasal spray and recommend over-the-counter Mucinex for symptom management, placed watch and wait antibiotic for day 10 of illness at pharmacy, prescribed azithromycin, recommended additional over-the-counter medications as needed with follow-up with urgent care as needed, work note given  Final Clinical Impressions(s) / UC Diagnoses   Final diagnoses:  None   Discharge Instructions   None    ED Prescriptions   None    PDMP not reviewed this encounter.   Valinda Hoar, [place] 02/28/22 [phone]

## 2022-02-28 NOTE — ED Triage Notes (Signed)
Pt reports with throat pain, jaw pain and ear pain for about 3 days. Hurts to swallow. Her brother just had strep throat she is concerned about that. Took tylenol for a sinus headache

## 2022-03-25 ENCOUNTER — Institutional Professional Consult (permissible substitution): Payer: 59 | Admitting: Internal Medicine

## 2022-04-02 ENCOUNTER — Encounter: Payer: Self-pay | Admitting: Internal Medicine

## 2023-01-02 NOTE — Progress Notes (Signed)
    GYNECOLOGY PROGRESS NOTE  Subjective:    Patient ID: Sherry Lutz, female    DOB: 02-Dec-1995, 27 y.o.   MRN: [id number]  HPI  Patient is a 27 y.o. G45P1001 female who is referred by Dr. Ardyth Gal for an abnormal lesion on cervical os, concern for cervical cancer.  During pt's pap test with Dr. Haze Rushing, she was told it looked very suspicious for cervical cancer and her tissue was "friable." Pt also with concerns about irregular periods. States she has been on the same [facility] pills since she was 59, has them today and they are Norethindrone 0.35mg  daily, she had 2 periods this month each lasting 12 days. Pt Wants to discuss switching pills. She does not smoke and denies hx of migraines with aura or VTE. Does have POTS syndrome and the irregular bleeding is becoming debilitating. She would like to suppress periods if possible.   The following portions of the patient's history were reviewed and updated as appropriate: allergies, current medications, past family history, past medical history, past social history, past surgical history, and problem list.  Review of Systems Pertinent items are noted in HPI.   Objective:   Blood pressure (!) 135/91, pulse 82, height 5\' 2"  (1.575 m), weight 126 lb (57.2 kg), last menstrual period 12/09/2022, not currently breastfeeding. Body mass index is 23.05 kg/m.  General appearance: alert and cooperative Abdomen: soft, non-tender; bowel sounds normal; no masses,  no organomegaly Pelvic: cervix normal in appearance, external genitalia normal, no adnexal masses or tenderness, no cervical motion tenderness, uterus normal size, shape, and consistency, and vagina normal without discharge. No cervical lesion appreciated. Transformation zone seen and normal appearing, cervical tissue not friable on today's exam. Pap was collected.  Extremities: extremities normal, atraumatic, no cyanosis or edema Neurologic: Grossly normal  Assessment:   1. Abnormal menstrual  periods   2. Cervical cancer screening     Plan:  [id number] referred here today by PCP for a concerning lesion seen on exam. Discussed exam findings today which did not appear abnormal. Reviewed normal anatomy of the cervix, including transformation zone, with medical illustrations and questions answered. Pap was collected due to outside results being unavailable today, did see HPV negative result from 12/22/22, did not repeat cotesting. Reassurance given that based on today's exam, did not grossly have any concerns and will call with pap results.   Irregular periods: likely iatrogenic due to pt taking Norethindrone for so long. She desires period suppression and only desires to take pills, so discussed starting her on a combined OCP, pt amenable.  -Urine HCG negative today. -Start oral contraceptives as prescribed: Junel 1/20, take consecutively for suppression.  -Cardiovascular and other risks discussed, aware that smoking while on OCPs increases the risk of blood clots. SEs discussed, including BTB, N&V, weight changes, increased moodiness. If symptoms are severe, or mild SEs do not improve after 3 mos, call or RTC to discuss options.  -Refrain from intercourse or use barrier contraception x 7 days after starting OCPs.  -Aware that OCPs protect against pregnancy only, not STDs. -Follow up in 3 months, sooner prn.   Julieanne Manson, DO [facility]

## 2023-01-05 ENCOUNTER — Other Ambulatory Visit (HOSPITAL_COMMUNITY)
Admission: RE | Admit: 2023-01-05 | Discharge: 2023-01-05 | Disposition: A | Discharge status: Home / Self Care | Payer: 59 | Source: Ambulatory Visit | Attending: Obstetrics | Admitting: Obstetrics

## 2023-01-05 ENCOUNTER — Encounter: Payer: Self-pay | Admitting: Obstetrics

## 2023-01-05 ENCOUNTER — Ambulatory Visit: Payer: 59 | Admitting: Obstetrics

## 2023-01-05 VITALS — BP 135/91 | HR 82 | Ht 62.0 in | Wt 126.0 lb

## 2023-01-05 DIAGNOSIS — N926 Irregular menstruation, unspecified: Secondary | ICD-10-CM | POA: Diagnosis not present

## 2023-01-05 DIAGNOSIS — Z124 Encounter for screening for malignant neoplasm of cervix: Secondary | ICD-10-CM

## 2023-01-05 DIAGNOSIS — Z3202 Encounter for pregnancy test, result negative: Secondary | ICD-10-CM | POA: Diagnosis not present

## 2023-01-05 LAB — POCT URINE PREGNANCY: Preg Test, Ur: NEGATIVE

## 2023-01-05 MED ORDER — NORETHIN ACE-ETH ESTRAD-FE 1-20 MG-MCG PO TABS: ORAL_TABLET | ORAL | 4 refills | Status: AC

## 2023-01-07 LAB — CYTOLOGY - PAP
Chlamydia: NEGATIVE
Comment: NEGATIVE
Comment: NORMAL
Diagnosis: NEGATIVE
Neisseria Gonorrhea: NEGATIVE

## 2023-01-12 ENCOUNTER — Encounter: Payer: Self-pay | Admitting: Obstetrics

## 2023-02-26 ENCOUNTER — Ambulatory Visit
Admission: EM | Admit: 2023-02-26 | Discharge: 2023-02-26 | Disposition: A | Discharge status: Home / Self Care | Payer: 59

## 2023-02-26 ENCOUNTER — Encounter: Payer: Self-pay | Admitting: Emergency Medicine

## 2023-02-26 DIAGNOSIS — J069 Acute upper respiratory infection, unspecified: Secondary | ICD-10-CM

## 2023-02-26 DIAGNOSIS — H6993 Unspecified Eustachian tube disorder, bilateral: Secondary | ICD-10-CM

## 2023-02-26 MED ORDER — IPRATROPIUM BROMIDE 0.06 % NA SOLN: 2.0000 | Freq: Four times a day (QID) | NASAL | 12 refills | Status: AC

## 2023-02-26 MED ORDER — FLUTICASONE PROPIONATE 50 MCG/ACT NA SUSP: 2.0000 | Freq: Every day | NASAL | 1 refills | Status: AC

## 2023-02-26 NOTE — ED Provider Notes (Signed)
[facility]    CSN: [id number] Arrival date & time: 02/26/23  1634      History   Chief Complaint Chief Complaint  Patient presents with   Otalgia    HPI Sherry Lutz is a 27 y.o. female.   HPI  27 year old female with a past medical history significant for RSD, POTS, and asthma presents for evaluation of 1 week worth of pain in both of her ears.  She also endorses some runny nose and nasal congestion as well as a infrequent nonproductive cough.  No fever or drainage from the ears.  Past Medical History:  Diagnosis Date   Asthma    Postpartum care following vaginal delivery 06/28/2021   POTS (postural orthostatic tachycardia syndrome)    RSD (reflex sympathetic dystrophy)    foot    Patient Active Problem List   Diagnosis Date Noted   Dizziness 11/22/2021   Tachycardia 11/22/2021   Orthostatic hypotension 11/22/2021   Encounter for care or examination of lactating mother 06/28/2021   Headache 06/12/2021   Complex regional pain syndrome I of other specified site 12/10/2020   Reactive airway disease 01/28/2019   RSD (reflex sympathetic dystrophy) 06/04/2011    Past Surgical History:  Procedure Laterality Date   INTERCOSTAL NERVE BLOCK      OB History     Gravida  1   Para  1   Term  1   Preterm      AB      Living  1      SAB      IAB      Ectopic      Multiple  0   Live Births  1            Home Medications    Prior to Admission medications   Medication Sig Start Date End Date Taking? Authorizing Provider  fluticasone (FLONASE) 50 MCG/ACT nasal spray Place 2 sprays into both nostrils daily. 02/26/23  Yes Becky Augusta, NP  ipratropium (ATROVENT) 0.06 % nasal spray Place 2 sprays into both nostrils 4 (four) times daily. 02/26/23  Yes Becky Augusta, NP  metoprolol tartrate (LOPRESSOR) 25 MG tablet Take 0.5 tablets (12.5 mg total) by mouth 2 (two) times daily. 11/27/21  Yes End, Cristal Deer, MD  midodrine (PROAMATINE) 5 MG  tablet Take 10 mg by mouth 3 (three) times daily.   Yes [provider]  Norethindrone Acetate-Ethinyl Estrad-FE (BLISOVI 24 FE) 1-20 MG-MCG(24) tablet Take 1 tablet by mouth daily.   Yes [provider]  azithromycin (ZITHROMAX) 250 MG tablet Take 1 tablet (250 mg total) by mouth daily. Take first 2 tablets together, then 1 every day until finished. Patient not taking: Reported on 01/05/2023 03/04/22   Valinda Hoar, NP  FEROSUL 325 (65 Fe) MG tablet Take 1 tablet (325 mg total) by mouth 2 (two) times daily with a meal. 11/09/21   Irean Hong, MD  fluticasone (FLOVENT HFA) 110 MCG/ACT inhaler Inhale into the lungs. 02/25/19 11/21/21  [provider]  meclizine (ANTIVERT) 25 MG tablet Take 1 tablet (25 mg total) by mouth every 6 (six) hours as needed for dizziness. 11/21/21   End, Cristal Deer, MD  norethindrone (MICRONOR) 0.35 MG tablet Take 1 tablet by mouth daily.    [provider]  norethindrone-ethinyl estradiol-FE (JUNEL FE 1/20) 1-20 MG-MCG tablet Take 1 tab daily. Skip placebo pills and immediately start new pack. 01/05/23   Julieanne Manson, MD    Family History Family History  Problem Relation Age of Onset   Healthy Mother    Healthy Father    Heart disease Sister        transposition of great vessels   Supraventricular tachycardia Maternal Grandmother    Coronary artery disease Maternal Grandfather        CABG at age 25    Social History Social History   Tobacco Use   Smoking status: Never   Smokeless tobacco: Never  Vaping Use   Vaping status: Never Used  Substance Use Topics   Alcohol use: No   Drug use: No     Allergies   Diphenhydramine hcl (sleep), Pregabalin, Norethin ace-eth estrad-fe, Amoxicillin, Levonorgest-eth estrad 91-day, and Red dye #40 (allura red)   Review of Systems Review of Systems  Constitutional:  Negative for fever.  HENT:  Positive for congestion, ear pain and rhinorrhea. Negative for ear discharge.    Respiratory:  Positive for cough.      Physical Exam Triage Vital Signs ED Triage Vitals  Encounter Vitals Group     BP 02/26/23 1701 (!) 144/92     Systolic BP Percentile --      Diastolic BP Percentile --      Pulse Rate 02/26/23 1701 77     Resp 02/26/23 1701 18     Temp 02/26/23 1701 99.1 F (37.3 C)     Temp Source 02/26/23 1701 Oral     SpO2 02/26/23 1701 100 %     Weight --      Height --      Head Circumference --      Peak Flow --      Pain Score 02/26/23 1656 8     Pain Loc --      Pain Education --      Exclude from Growth Chart --    No data found.  Updated Vital Signs BP (!) 144/92 (BP Location: Left Arm)   Pulse 77   Temp 99.1 F (37.3 C) (Oral)   Resp 18   SpO2 100%   Visual Acuity Right Eye Distance:   Left Eye Distance:   Bilateral Distance:    Right Eye Near:   Left Eye Near:    Bilateral Near:     Physical Exam Vitals and nursing note reviewed.  Constitutional:      Appearance: Normal appearance. She is not ill-appearing.  HENT:     Head: Normocephalic and atraumatic.     Right Ear: Tympanic membrane, ear canal and external ear normal. There is no impacted cerumen.     Left Ear: Tympanic membrane, ear canal and external ear normal. There is no impacted cerumen.     Ears:     Comments: Patient has tenderness to external palpation of bilateral eustachian tubes, left greater than right. Cardiovascular:     Rate and Rhythm: Normal rate and regular rhythm.     Pulses: Normal pulses.     Heart sounds: Normal heart sounds. No murmur heard.    No friction rub. No gallop.  Pulmonary:     Effort: Pulmonary effort is normal.     Breath sounds: Normal breath sounds. No wheezing, rhonchi or rales.  Skin:    General: Skin is warm and dry.     Capillary Refill: Capillary refill takes less than 2 seconds.     Findings: No erythema or rash.  Neurological:     General: No focal deficit present.     Mental Status: She is alert and oriented  to  person, place, and time.      UC Treatments / Results  Labs (all labs ordered are listed, but only abnormal results are displayed) Labs Reviewed - No data to display  EKG   Radiology No results found.  Procedures Procedures (including critical care time)  Medications Ordered in UC Medications - No data to display  Initial Impression / Assessment and Plan / UC Course  I have reviewed the triage vital signs and the nursing notes.  Pertinent labs & imaging results that were available during my care of the patient were reviewed by me and considered in my medical decision making (see chart for details).   Patient is a pleasant, nontoxic-appearing 27 year old female presenting for evaluation of 1 week worth of bilateral ear pain with associated upper and lower respiratory symptoms.  On exam both of her tympanic membranes are pearly gray in appearance with normal light reflex.  She does have tenderness with external palpation of bilateral eustachian tubes, left greater than the right.  Cardiopulmonary exam is benign.  Patient exam is consistent with an upper respiratory infection and bilateral eustachian tube dysfunction.  I will discharge her home with Atrovent nasal spray to help with nasal congestion along with Flonase nasal spray that she can do 2 squirts of each nostril 4 times a day to help decrease eustachian tube inflammation.  We discussed using over-the-counter antihistamine such as Claritin, Zyrtec, or Allegra as well as frequent Valsalva maneuver to attempt to clear her eustachian tubes.  I also suggested to her that she use a heating pad set on low underneath your pillowcase at night to help dilate up your eustachian tube and facilitate drainage.  Return precautions reviewed.   Final Clinical Impressions(s) / UC Diagnoses   Final diagnoses:  Viral upper respiratory tract infection  Eustachian tube dysfunction, bilateral     Discharge Instructions      Use the Atrovent  nasal spray, 2 squirts in each nostril every 6 hours, to help with nasal congestion.  Take over-the-counter Zyrtec, Claritin, or Allegra once daily to help with allergic symptoms.  Instill 2 squirts of fluticasone in each nostril at bedtime nightly.  And the nasal away from the septum of your nose and follow each set of squirts with 1 squirt of nasal saline to push the particles up into your turbinates where they will take effect.  Continue to equalize your ears as shown to help clear mucus from eustachian tubes and maintain patency.   Place a heating pad underneath your pillowcase and keep it on low when you go to bed at night.  The warmth will help dilate up eustachian tube and facilitate drainage.     ED Prescriptions     Medication Sig Dispense Auth. Provider   ipratropium (ATROVENT) 0.06 % nasal spray Place 2 sprays into both nostrils 4 (four) times daily. 15 mL Becky Augusta, NP   fluticasone [facility]) 50 MCG/ACT nasal spray Place 2 sprays into both nostrils daily. 18.2 mL Becky Augusta, NP      PDMP not reviewed this encounter.   Becky Augusta, NP 02/26/23 [phone]

## 2023-02-26 NOTE — ED Triage Notes (Signed)
Pt presents with bilateral ear pain x 1 week.

## 2023-02-26 NOTE — Discharge Instructions (Addendum)
Use the Atrovent nasal spray, 2 squirts in each nostril every 6 hours, to help with nasal congestion.  Take over-the-counter Zyrtec, Claritin, or Allegra once daily to help with allergic symptoms.  Instill 2 squirts of fluticasone in each nostril at bedtime nightly.  And the nasal away from the septum of your nose and follow each set of squirts with 1 squirt of nasal saline to push the particles up into your turbinates where they will take effect.  Continue to equalize your ears as shown to help clear mucus from eustachian tubes and maintain patency.   Place a heating pad underneath your pillowcase and keep it on low when you go to bed at night.  The warmth will help dilate up eustachian tube and facilitate drainage.

## 2023-02-28 ENCOUNTER — Ambulatory Visit
Admission: EM | Admit: 2023-02-28 | Discharge: 2023-02-28 | Disposition: A | Discharge status: Home / Self Care | Payer: 59

## 2023-02-28 DIAGNOSIS — J019 Acute sinusitis, unspecified: Secondary | ICD-10-CM

## 2023-02-28 DIAGNOSIS — H9202 Otalgia, left ear: Secondary | ICD-10-CM | POA: Diagnosis not present

## 2023-02-28 MED ORDER — DOXYCYCLINE HYCLATE 100 MG PO CAPS: 100.0000 mg | ORAL_CAPSULE | Freq: Two times a day (BID) | ORAL | 0 refills | Status: AC

## 2023-02-28 NOTE — ED Provider Notes (Signed)
[facility]    CSN: [id number] Arrival date & time: 02/28/23  1033      History   Chief Complaint Chief Complaint  Patient presents with   Ear Pain    HPI Sherry Lutz is a 27 y.o. female presenting for 1.5-week history of nasal congestion, sinus pressure, left-sided ear pain/pressure and fullness.  She says she thinks she has had some drainage from her left ear over the past couple days.  Denies fever, sore throat.  Occasional cough but it is mostly dry.  No chest pain, wheezing or shortness of breath.  Patient seen here 2 days ago and given Atrovent nasal spray and Flonase nasal spray.  Has been using these nasal sprays and also taking Tylenol with no relief.  She has a history of POTS and says that she cannot take a lot of OTC meds including decongestants.  She believes she might have an ear infection or sinus infection at this time.  HPI  Past Medical History:  Diagnosis Date   Asthma    Postpartum care following vaginal delivery 06/28/2021   POTS (postural orthostatic tachycardia syndrome)    RSD (reflex sympathetic dystrophy)    foot    Patient Active Problem List   Diagnosis Date Noted   Dizziness 11/22/2021   Tachycardia 11/22/2021   Orthostatic hypotension 11/22/2021   Encounter for care or examination of lactating mother 06/28/2021   Headache 06/12/2021   Complex regional pain syndrome I of other specified site 12/10/2020   Reactive airway disease 01/28/2019   RSD (reflex sympathetic dystrophy) 06/04/2011    Past Surgical History:  Procedure Laterality Date   INTERCOSTAL NERVE BLOCK      OB History     Gravida  1   Para  1   Term  1   Preterm      AB      Living  1      SAB      IAB      Ectopic      Multiple  0   Live Births  1            Home Medications    Prior to Admission medications   Medication Sig Start Date End Date Taking? Authorizing Provider  doxycycline (VIBRAMYCIN) 100 MG capsule Take 1  capsule (100 mg total) by mouth 2 (two) times daily for 7 days. 02/28/23 03/07/23 Yes Eusebio Friendly B, PA-C  fluticasone (FLONASE) 50 MCG/ACT nasal spray Place 2 sprays into both nostrils daily. 02/26/23  Yes Becky Augusta, NP  ipratropium (ATROVENT) 0.06 % nasal spray Place 2 sprays into both nostrils 4 (four) times daily. 02/26/23  Yes Becky Augusta, NP  metoprolol tartrate (LOPRESSOR) 25 MG tablet Take 0.5 tablets (12.5 mg total) by mouth 2 (two) times daily. 11/27/21  Yes End, Cristal Deer, MD  midodrine (PROAMATINE) 5 MG tablet Take 10 mg by mouth 3 (three) times daily.   Yes [provider]  Norethindrone Acetate-Ethinyl Estrad-FE (BLISOVI 24 FE) 1-20 MG-MCG(24) tablet Take 1 tablet by mouth daily.   Yes [provider]  sodium chloride 1 g tablet Take by mouth. 12/04/22 12/04/23 Yes [provider]  FEROSUL 325 (65 Fe) MG tablet Take 1 tablet (325 mg total) by mouth 2 (two) times daily with a meal. 11/09/21   Irean Hong, MD  fluticasone (FLOVENT HFA) 110 MCG/ACT inhaler Inhale into the lungs. 02/25/19 11/21/21  [provider]  meclizine (ANTIVERT) 25 MG tablet Take 1 tablet (  25 mg total) by mouth every 6 (six) hours as needed for dizziness. 11/21/21   End, Cristal Deer, MD  norethindrone (MICRONOR) 0.35 MG tablet Take 1 tablet by mouth daily.    [provider]  norethindrone-ethinyl estradiol-FE (JUNEL FE 1/20) 1-20 MG-MCG tablet Take 1 tab daily. Skip placebo pills and immediately start new pack. 01/05/23   Julieanne Manson, MD    Family History Family History  Problem Relation Age of Onset   Healthy Mother    Healthy Father    Heart disease Sister        transposition of great vessels   Supraventricular tachycardia Maternal Grandmother    Coronary artery disease Maternal Grandfather        CABG at age 57    Social History Social History   Tobacco Use   Smoking status: Never   Smokeless tobacco: Never  Vaping Use   Vaping status: Never Used   Substance Use Topics   Alcohol use: No   Drug use: No     Allergies   Diphenhydramine hcl (sleep), Pregabalin, Norethin ace-eth estrad-fe, Amoxicillin, Levonorgest-eth estrad 91-day, and Red dye #40 (allura red)   Review of Systems Review of Systems  Constitutional:  Positive for fatigue. Negative for chills, diaphoresis and fever.  HENT:  Positive for congestion, ear discharge, ear pain, rhinorrhea, sinus pressure and sinus pain. Negative for sore throat.   Respiratory:  Positive for cough. Negative for shortness of breath.   Gastrointestinal:  Negative for abdominal pain, nausea and vomiting.  Musculoskeletal:  Negative for arthralgias and myalgias.  Skin:  Negative for rash.  Neurological:  Positive for headaches. Negative for weakness.  Hematological:  Negative for adenopathy.     Physical Exam Triage Vital Signs ED Triage Vitals  Encounter Vitals Group     BP 02/28/23 1127 (!) 143/78     Systolic BP Percentile --      Diastolic BP Percentile --      Pulse Rate 02/28/23 1127 79     Resp --      Temp 02/28/23 1127 99 F (37.2 C)     Temp Source 02/28/23 1127 Oral     SpO2 02/28/23 1127 99 %     Weight 02/28/23 1125 125 lb (56.7 kg)     Height 02/28/23 1125 5\' 2"  (1.575 m)     Head Circumference --      Peak Flow --      Pain Score 02/28/23 1124 8     Pain Loc --      Pain Education --      Exclude from Growth Chart --    No data found.  Updated Vital Signs BP (!) 143/78 (BP Location: Left Arm)   Pulse 79   Temp 99 F (37.2 C) (Oral)   Ht 5\' 2"  (1.575 m)   Wt 125 lb (56.7 kg)   SpO2 99%   BMI 22.86 kg/m    Physical Exam Vitals and nursing note reviewed.  Constitutional:      General: She is not in acute distress.    Appearance: Normal appearance. She is not ill-appearing or toxic-appearing.  HENT:     Head: Normocephalic and atraumatic.     Right Ear: Ear canal and external ear normal. A middle ear effusion is present.     Left Ear: Ear canal and  external ear normal. A middle ear effusion is present. Tympanic membrane is retracted.     Nose: Congestion present.     Mouth/Throat:  Mouth: Mucous membranes are moist.     Pharynx: Oropharynx is clear.  Eyes:     General: No scleral icterus.       Right eye: No discharge.        Left eye: No discharge.     Conjunctiva/sclera: Conjunctivae normal.  Cardiovascular:     Rate and Rhythm: Normal rate and regular rhythm.     Heart sounds: Normal heart sounds.  Pulmonary:     Effort: Pulmonary effort is normal. No respiratory distress.     Breath sounds: Normal breath sounds.  Musculoskeletal:     Cervical back: Neck supple.  Skin:    General: Skin is dry.  Neurological:     General: No focal deficit present.     Mental Status: She is alert. Mental status is at baseline.     Motor: No weakness.     Gait: Gait normal.  Psychiatric:        Mood and Affect: Mood normal.        Behavior: Behavior normal.        Thought Content: Thought content normal.      UC Treatments / Results  Labs (all labs ordered are listed, but only abnormal results are displayed) Labs Reviewed - No data to display  EKG   Radiology No results found.  Procedures Procedures (including critical care time)  Medications Ordered in UC Medications - No data to display  Initial Impression / Assessment and Plan / UC Course  I have reviewed the triage vital signs and the nursing notes.  Pertinent labs & imaging results that were available during my care of the patient were reviewed by me and considered in my medical decision making (see chart for details).   27 year old female presents for 1-1/2-week history of congestion, sinus pressure, cough, left-sided ear pain/fullness.  Seen here 2 days ago for the symptoms and given Atrovent nasal spray and Flonase nasal spray.  Is also been taking Tylenol without relief.  Has history of POTS and cannot take decongestants.  She believes she has an ear infection  or sinus infection at this time.  No fever or worsening symptoms just not feeling better.  Vitals are stable.  She is overall well-appearing.  On exam she has effusion of bilateral TMs with retraction of the left TM.  Nasal congestion.  Throat clear.  Chest clear to auscultation.  Symptoms consistent with viral URI but could be developing sinusitis.  Has a history of sinusitis.  States she cannot take penicillin.  Will prescribe doxycycline to cover for bacterial sinusitis.  Advised to continue the nasal sprays and Tylenol.  We discussed that in the case that the symptoms are viral hip will take more time for everything to run its course, can take a few weeks.  Advised her to return for fever or worsening symptoms.   Final Clinical Impressions(s) / UC Diagnoses   Final diagnoses:  Acute sinusitis, recurrence not specified, unspecified location  Acute otalgia, left     Discharge Instructions      -You do not have an ear infection.  You do have fluid behind the ear.  Continue with the nasal sprays and Tylenol. - I sent an antibiotic in case you are developing a bacterial sinus infection but this is really more consistent with a viral upper respiratory infection which can take a couple weeks to resolve. - If you develop fever or feel worse please come back and let us reassess.     ED  Prescriptions     Medication Sig Dispense Auth. Provider   doxycycline (VIBRAMYCIN) 100 MG capsule Take 1 capsule (100 mg total) by mouth 2 (two) times daily for 7 days. 14 capsule Shirlee Latch, PA-C      PDMP not reviewed this encounter.   Shirlee Latch, PA-C 02/28/23 1203

## 2023-02-28 NOTE — Discharge Instructions (Addendum)
-  You do not have an ear infection.  You do have fluid behind the ear.  Continue with the nasal sprays and Tylenol. - I sent an antibiotic in case you are developing a bacterial sinus infection but this is really more consistent with a viral upper respiratory infection which can take a couple weeks to resolve. - If you develop fever or feel worse please come back and let us reassess.

## 2023-02-28 NOTE — ED Triage Notes (Signed)
Pt c/o continued symptoms x2weeks  Pt states that her ears have become worse and she is now having headache, jaw pain, and left side ear drainage.   Pt has been using Tylenol for Symptoms. Pt last dose was at 8am. Pt states that it is not helping.

## 2023-04-01 ENCOUNTER — Ambulatory Visit
Admission: EM | Admit: 2023-04-01 | Discharge: 2023-04-01 | Disposition: A | Discharge status: Home / Self Care | Payer: BC Managed Care – PPO | Attending: Family Medicine | Admitting: Family Medicine

## 2023-04-01 ENCOUNTER — Encounter: Payer: Self-pay | Admitting: *Deleted

## 2023-04-01 DIAGNOSIS — J0100 Acute maxillary sinusitis, unspecified: Secondary | ICD-10-CM | POA: Diagnosis not present

## 2023-04-01 DIAGNOSIS — G90A Postural orthostatic tachycardia syndrome (POTS): Secondary | ICD-10-CM | POA: Diagnosis not present

## 2023-04-01 DIAGNOSIS — R030 Elevated blood-pressure reading, without diagnosis of hypertension: Secondary | ICD-10-CM

## 2023-04-01 MED ORDER — AZITHROMYCIN 250 MG PO TABS: ORAL_TABLET | ORAL | 0 refills | Status: AC

## 2023-04-01 MED ORDER — PREDNISONE 10 MG (21) PO TBPK: ORAL_TABLET | Freq: Every day | ORAL | 0 refills | Status: AC

## 2023-04-01 NOTE — ED Provider Notes (Signed)
 [facility]    CSN: [id number] Arrival date & time: 04/01/23  1552      History   Chief Complaint Chief Complaint  Patient presents with   sinus pressure    HPI Sherry Lutz is a 28 y.o. female.   HPI  History obtained from the patient. Sherry Lutz presents for facial pain, sinus pressure, nasal congestion and headache for the past 4 weeks.  She has bilateral ear pain that is worse on the left.  She feels like the pain is radiating down her neck.  Tylenol  is not providing her any relief.  Notes she has had similar symptoms in the past.  Has not been taking over-the-counter medications as she cannot take many of them as she has POTS.  BP at highest 150/70. But a few nights ago it was 130/70s. Takes midodrine 5 mg. Last took around 1230 PM.  Follows with cardiology.  She was diagnosed after she her pregnancy.       Past Medical History:  Diagnosis Date   Asthma    Postpartum care following vaginal delivery 06/28/2021   POTS (postural orthostatic tachycardia syndrome)    RSD (reflex sympathetic dystrophy)    foot    Patient Active Problem List   Diagnosis Date Noted   Dizziness 11/22/2021   Tachycardia 11/22/2021   Orthostatic hypotension 11/22/2021   Encounter for care or examination of lactating mother 06/28/2021   Headache 06/12/2021   Complex regional pain syndrome I of other specified site 12/10/2020   Reactive airway disease 01/28/2019   RSD (reflex sympathetic dystrophy) 06/04/2011    Past Surgical History:  Procedure Laterality Date   INTERCOSTAL NERVE BLOCK      OB History     Gravida  1   Para  1   Term  1   Preterm      AB      Living  1      SAB      IAB      Ectopic      Multiple  0   Live Births  1            Home Medications    Prior to Admission medications   Medication Sig Start Date End Date Taking? Authorizing Provider  azithromycin  (ZITHROMAX  Z-PAK) 250 MG tablet Take 2 tablets on day 1 then 1  tablet daily 04/01/23  Yes Vergil Burby, DO  predniSONE  (STERAPRED UNI-PAK 21 TAB) 10 MG (21) TBPK tablet Take by mouth daily. Take 6 tabs by mouth daily for 1, then 5 tabs for 1 day, then 4 tabs for 1 day, then 3 tabs for 1 day, then 2 tabs for 1 day, then 1 tab for 1 day. 04/01/23  Yes Mccauley Diehl, DO  FEROSUL 325 (65 Fe) MG tablet Take 1 tablet (325 mg total) by mouth 2 (two) times daily with a meal. 11/09/21   Robinette Vermell PARAS, MD  fluticasone  (FLONASE ) 50 MCG/ACT nasal spray Place 2 sprays into both nostrils daily. 02/26/23   Bernardino Ditch, NP  fluticasone  (FLOVENT  HFA) 110 MCG/ACT inhaler Inhale into the lungs. 02/25/19 11/21/21  [provider]  ipratropium (ATROVENT ) 0.06 % nasal spray Place 2 sprays into both nostrils 4 (four) times daily. 02/26/23   Bernardino Ditch, NP  meclizine  (ANTIVERT ) 25 MG tablet Take 1 tablet (25 mg total) by mouth every 6 (six) hours as needed for dizziness. 11/21/21   End, Lonni, MD  metoprolol  tartrate (LOPRESSOR ) 25 MG tablet Take 0.5  tablets (12.5 mg total) by mouth 2 (two) times daily. 11/27/21   End, Lonni, MD  midodrine (PROAMATINE) 5 MG tablet Take 10 mg by mouth 3 (three) times daily.    [provider]  norethindrone  (MICRONOR ) 0.35 MG tablet Take 1 tablet by mouth daily.    [provider]  Norethindrone  Acetate-Ethinyl Estrad-FE (BLISOVI 24 FE) 1-20 MG-MCG(24) tablet Take 1 tablet by mouth daily.    [provider]  norethindrone -ethinyl estradiol-FE (JUNEL FE 1/20) 1-20 MG-MCG tablet Take 1 tab daily. Skip placebo pills and immediately start new pack. 01/05/23   Leigh Sober, MD  sodium chloride  1 g tablet Take by mouth. 12/04/22 12/04/23  [provider]    Family History Family History  Problem Relation Age of Onset   Healthy Mother    Healthy Father    Heart disease Sister        transposition of great vessels   Supraventricular tachycardia Maternal Grandmother    Coronary artery disease Maternal  Grandfather        CABG at age 43    Social History Social History   Tobacco Use   Smoking status: Never   Smokeless tobacco: Never  Vaping Use   Vaping status: Never Used  Substance Use Topics   Alcohol use: No   Drug use: No     Allergies   Diphenhydramine  hcl (sleep), Pregabalin, Norethin  ace-eth estrad-fe, Amoxicillin, Levonorgest-eth estrad 91-day, and Red dye #40 (allura red)   Review of Systems Review of Systems: negative unless otherwise stated in HPI.      Physical Exam Triage Vital Signs ED Triage Vitals  Encounter Vitals Group     BP 04/01/23 1658 (!) 174/67     Systolic BP Percentile --      Diastolic BP Percentile --      Pulse Rate 04/01/23 1658 88     Resp 04/01/23 1658 16     Temp 04/01/23 1658 98.6 F (37 C)     Temp Source 04/01/23 1658 Oral     SpO2 04/01/23 1658 99 %     Weight 04/01/23 1656 125 lb (56.7 kg)     Height 04/01/23 1656 5' 2 (1.575 m)     Head Circumference --      Peak Flow --      Pain Score 04/01/23 1656 0     Pain Loc --      Pain Education --      Exclude from Growth Chart --    No data found.  Updated Vital Signs BP (!) 190/73 Comment: provider notified  Pulse 88   Temp 98.6 F (37 C) (Oral)   Resp 16   Ht 5' 2 (1.575 m)   Wt 56.7 kg   SpO2 99%   BMI 22.86 kg/m   Visual Acuity Right Eye Distance:   Left Eye Distance:   Bilateral Distance:    Right Eye Near:   Left Eye Near:    Bilateral Near:     Physical Exam GEN:     alert, non-toxic appearing female in no distress    HENT:  mucus membranes moist, oropharyngeal without lesions or erythema, no tonsillar hypertrophy or exudates, moderate erythematous edematous turbinates, +nasal discharge, bilateral TM normal, no TMJ tenderness, maxillary sinus tenderness, left frontal sinus tenderness EYES:   no scleral injection or discharge NECK:  normal ROM, +lymphadenopathy, no meningismus   RESP:  no increased work of breathing, clear to auscultation  bilaterally CVS:   regular  rate and rhythm Skin:   warm and dry    UC Treatments / Results  Labs (all labs ordered are listed, but only abnormal results are displayed) Labs Reviewed - No data to display  EKG   Radiology No results found.  Procedures Procedures (including critical care time)  Medications Ordered in UC Medications - No data to display  Initial Impression / Assessment and Plan / UC Course  I have reviewed the triage vital signs and the nursing notes.  Pertinent labs & imaging results that were available during my care of the patient were reviewed by me and considered in my medical decision making (see chart for details).       Pt is a 28 y.o. female who presents for sinus pressure and nasal congestion. Sherry Lutz is afebrile here . Satting well on room air.  History and exam support sinusitis.  Treat with Augmentin as below.  Steroids prescribed in the event that the Augmentin does not clear up her infection completely.  Kristine is hypertensive here.  Cardiopulmonary exam is unremarkable.  BP 174/67 then after sitting was 190/73. She has POTS.  She reports home blood pressures have not been this elevated.  This is the first time they have been this high.  Took her midodrine ~5 hours ago.  Previous blood pressures from chart review were not elevated.  She was seen on 01/05/2023 at her OB/GYN's and her BP was 135/91 and on 12/22/2022 her blood pressure was 110/80 at her PCPs office.  I suspect high blood pressure issue is secondary to midodrine use and whitecoat hypertension.  Recommended she check herblood pressure and follow up with her primary care provider or cardiologist in the next 1-2 weeks.   Return and ED precautions given and voiced understanding. Discussed MDM, treatment plan and plan for follow-up with patient who agrees with plan.     Final Clinical Impressions(s) / UC Diagnoses   Final diagnoses:  Acute non-recurrent maxillary sinusitis  POTS  (postural orthostatic tachycardia syndrome)  Elevated blood pressure reading     Discharge Instructions      Stop by the pharmacy to pick up your prescriptions.  Follow up with your cardiologist.      ED Prescriptions     Medication Sig Dispense Auth. Provider   azithromycin  (ZITHROMAX  Z-PAK) 250 MG tablet Take 2 tablets on day 1 then 1 tablet daily 6 tablet Ezzie Senat, DO   predniSONE  (STERAPRED UNI-PAK 21 TAB) 10 MG (21) TBPK tablet Take by mouth daily. Take 6 tabs by mouth daily for 1, then 5 tabs for 1 day, then 4 tabs for 1 day, then 3 tabs for 1 day, then 2 tabs for 1 day, then 1 tab for 1 day. 21 tablet Haygen Zebrowski, DO      PDMP not reviewed this encounter.   Kriste Berth, DO 04/04/23 1511

## 2023-04-01 NOTE — ED Triage Notes (Signed)
 Patient states sinus pressure/headache for about 4 weeks that hasn't gone away.  Bilateral ear pain that is worse on left and radiates down the neck, no relief with Tylenol.

## 2023-04-01 NOTE — Discharge Instructions (Signed)
 Stop by the pharmacy to pick up your prescriptions.  Follow up with your cardiologist.

## 2023-09-16 IMAGING — MR MR MRV HEAD W/O CM
2 series · 48 of 48 positions shown · non-contrast
Comparison: No pertinent prior exam.

CLINICAL DATA: headcahe, see neuro consult on [DATE]; headaches, see
neuro consult [DATE]

EXAM:
MRI HEAD WITHOUT CONTRAST
MRV HEAD WITHOUT CONTRAST
TECHNIQUE: Multiplanar, multi-echo pulse sequences of the brain and surrounding
structures were acquired without intravenous contrast. Angiographic
images of the intracranial venous structures were acquired using MRV
technique without intravenous contrast.

[Series 10: flow_pc3d_sag_p3_(id)_sinus_msum · sagittal · 1.0mm · 0.94mm/px · 19 of 64 slices shown]
[im 1/64]
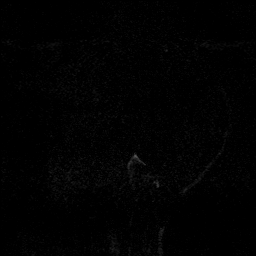
[im 4/64]
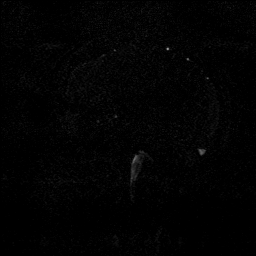
[im 8/64]
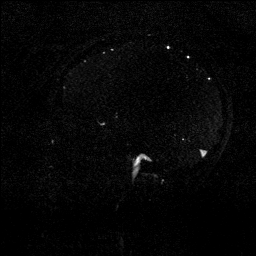
[im 11/64]
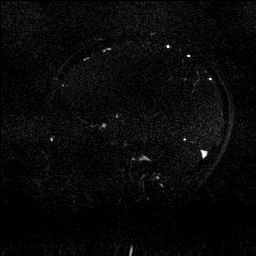
[im 15/64]
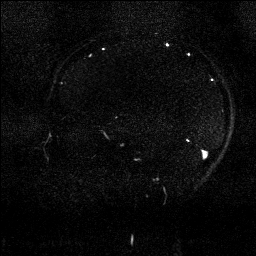
[im 18/64]
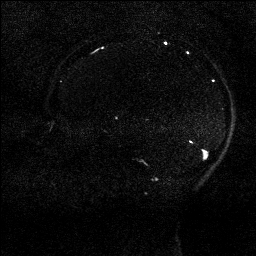
[im 22/64]
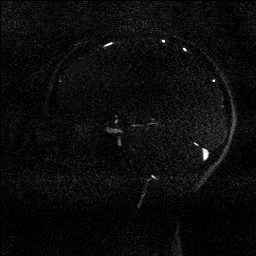
[im 25/64]
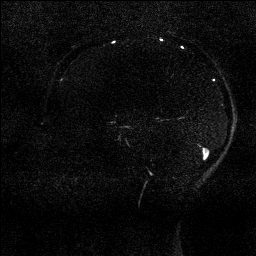
[im 29/64]
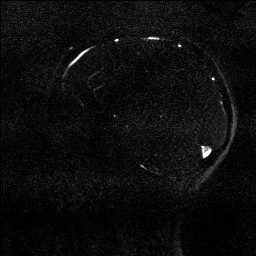
[im 32/64]
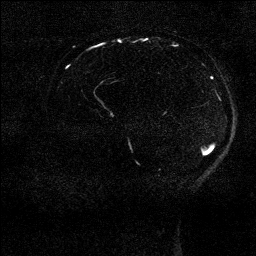
[im 36/64]
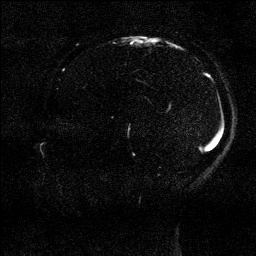
[im 39/64]
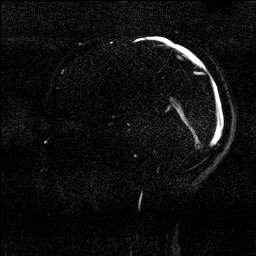
[im 43/64]
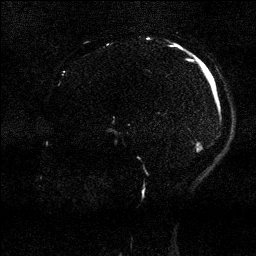
[im 46/64]
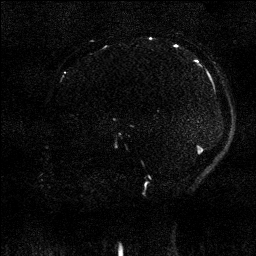
[im 50/64]
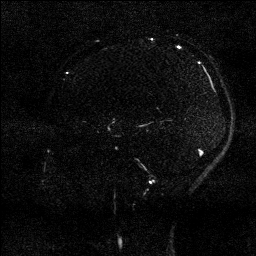
[im 53/64]
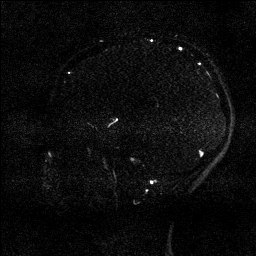
[im 57/64]
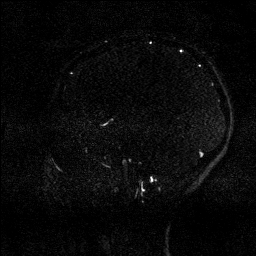
[im 60/64]
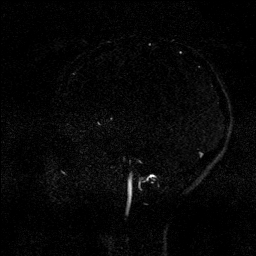
[im 64/64]
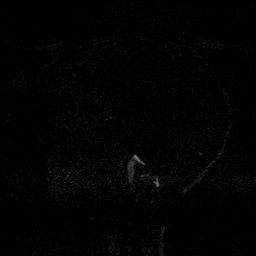

[Series 14: TOF · coronal · 2.5mm · 0.98mm/px · 29 of 99 slices shown]
[im 1/99]
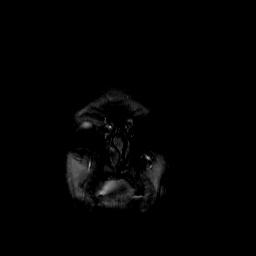
[im 4/99]
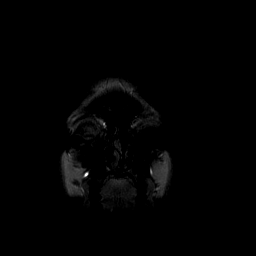
[im 8/99]
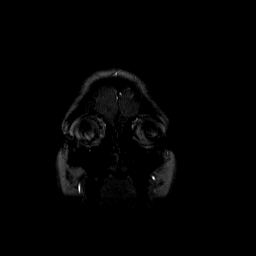
[im 11/99]
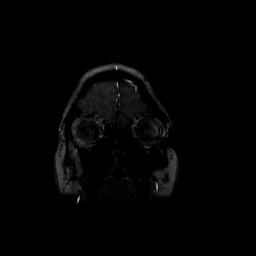
[im 15/99]
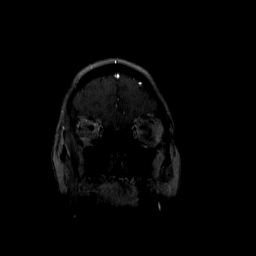
[im 18/99]
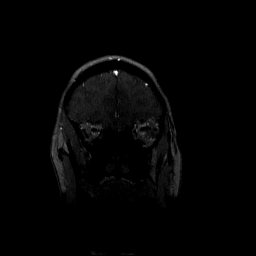
[im 22/99]
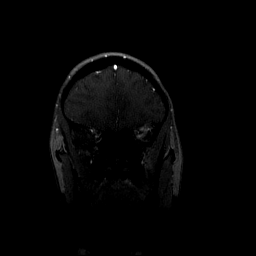
[im 25/99]
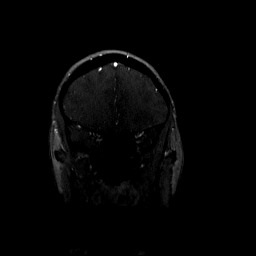
[im 29/99]
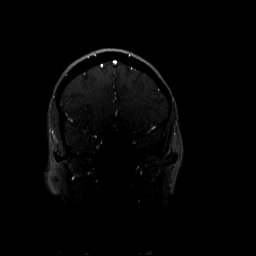
[im 32/99]
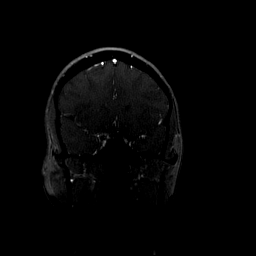
[im 36/99]
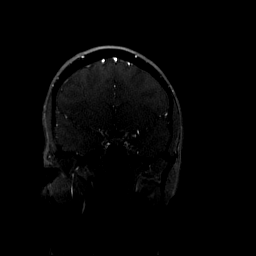
[im 39/99]
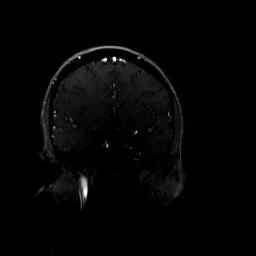
[im 43/99]
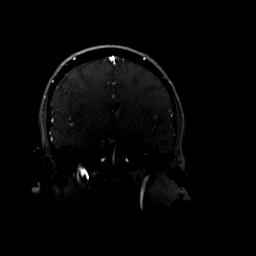
[im 46/99]
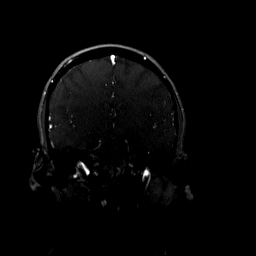
[im 50/99]
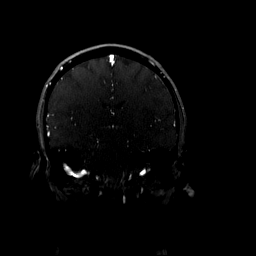
[im 53/99]
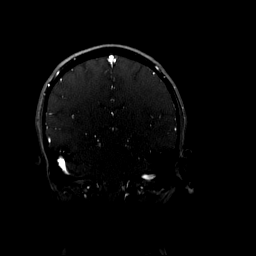
[im 57/99]
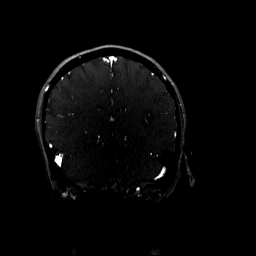
[im 60/99]
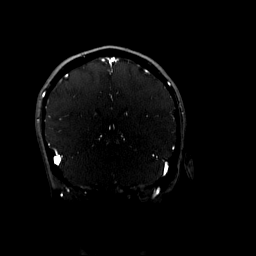
[im 64/99]
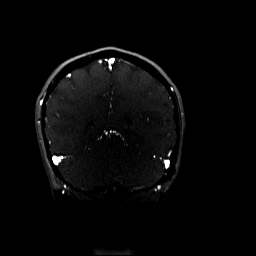
[im 67/99]
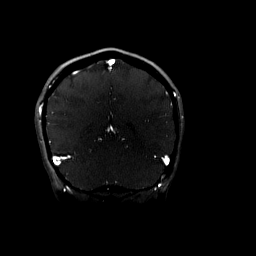
[im 71/99]
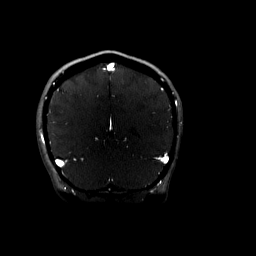
[im 74/99]
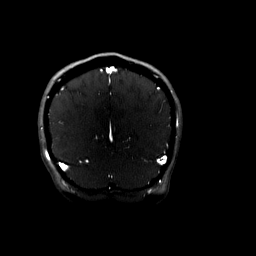
[im 78/99]
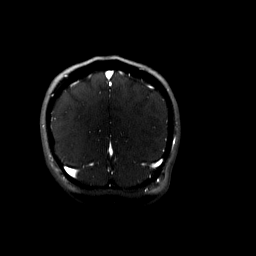
[im 81/99]
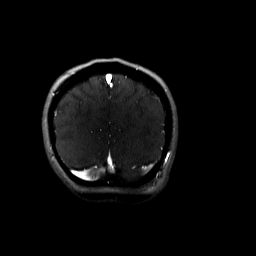
[im 85/99]
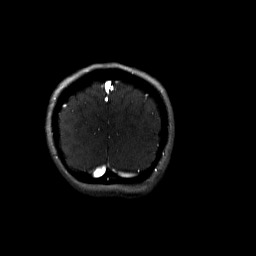
[im 88/99]
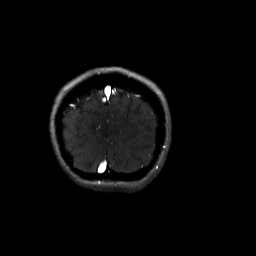
[im 92/99]
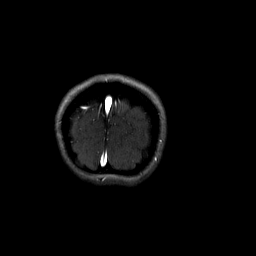
[im 95/99]
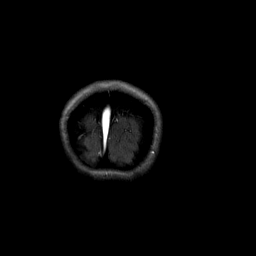
[im 99/99]
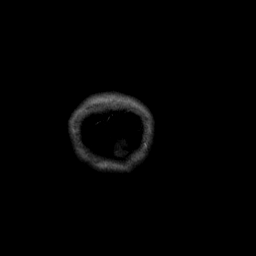

[48 of 48 positions shown; findings below may reference images not displayed]

FINDINGS: MRI HEAD WITHOUT CONTRAST

Brain: No acute infarction, hemorrhage, hydrocephalus, extra-axial
collection or mass lesion. Enlarged and upwardly convex pituitary
gland.

Vascular: Major arterial flow voids are maintained at the skull
base.

Skull and upper cervical spine: Normal marrow signal.

Sinuses/Orbits: Clear sinuses.  No acute orbital findings.

Other: No mastoid effusions.

MR VENOGRAM WITHOUT CONTRAST

No evidence of dural venous sinus thrombosis. The superior sagittal
sinus, sigmoid, transverse and straight sinuses are patent.
Visualized deep cerebral veins are patent. Jugular bulbs are patent.
IMPRESSION: 1. No evidence of acute intracranial abnormality.
2. No evidence of dural venous sinus thrombosis.
3. Enlarged and upwardly convex pituitary gland, likely physiologic
hypertrophy given the patient is pregnant. A follow-up MRI after
pregnancy could ensure resolution if clinically warranted.

## 2023-09-16 IMAGING — MR MR HEAD W/O CM
9 series · 48 of 48 positions shown · non-contrast
Comparison: No pertinent prior exam.

CLINICAL DATA: headcahe, see neuro consult on [DATE]; headaches, see
neuro consult [DATE]

EXAM:
MRI HEAD WITHOUT CONTRAST
MRV HEAD WITHOUT CONTRAST
TECHNIQUE: Multiplanar, multi-echo pulse sequences of the brain and surrounding
structures were acquired without intravenous contrast. Angiographic
images of the intracranial venous structures were acquired using MRV
technique without intravenous contrast.

[Series 9: T1 · sagittal · 4.0mm · 0.75mm/px · 3 of 31 slices shown (1 of 2)]
[im 1/31]
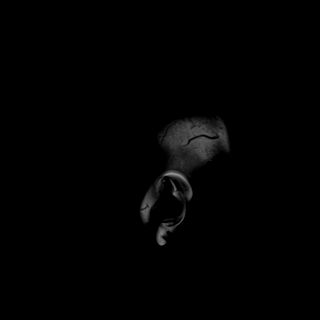
[im 16/31]
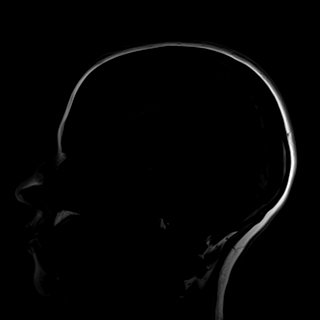
[im 31/31]
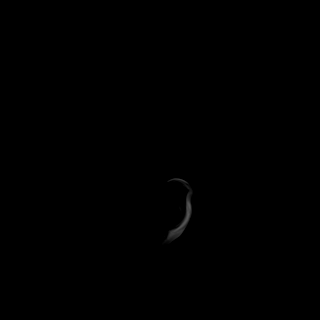

[Series 10: DWI · axial · 3.0mm · 0.94mm/px · z∈[-82,+58]mm · 13 of 160 slices shown]
[im 1/160]
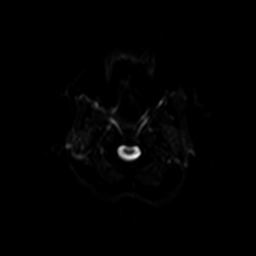
[im 14/160]
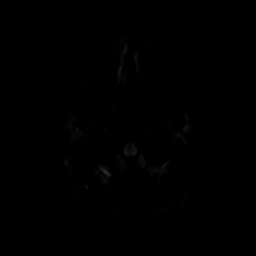
[im 27/160]
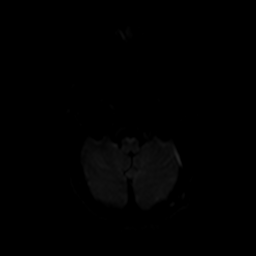
[im 40/160]
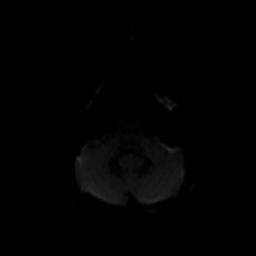
[im 54/160]
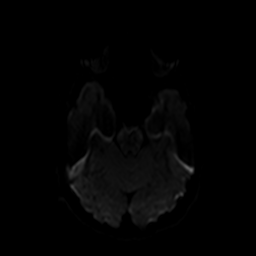
[im 67/160]
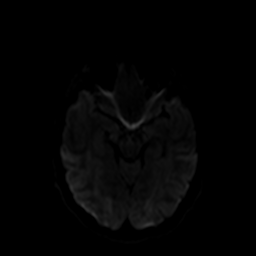
[im 80/160]
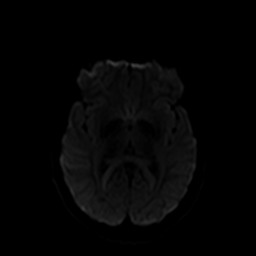
[im 93/160]
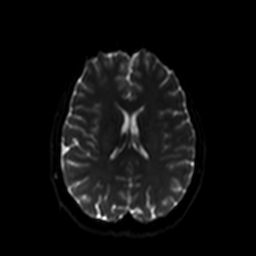
[im 107/160]
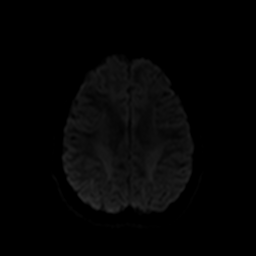
[im 120/160]
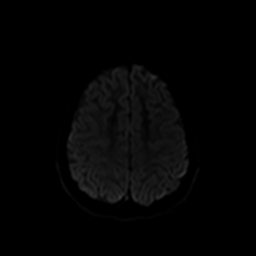
[im 133/160]
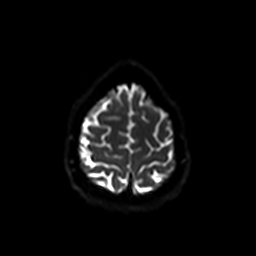
[im 146/160]
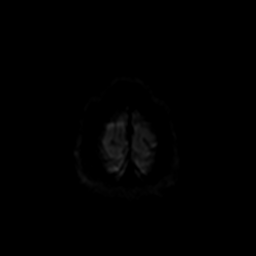
[im 160/160]
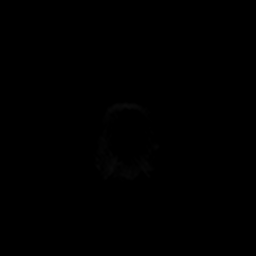

[Series 11: ax dwi_tracew · axial · 3.0mm · 0.94mm/px · z∈[-82,+58]mm · 6 of 80 slices shown]
[im 1/80]
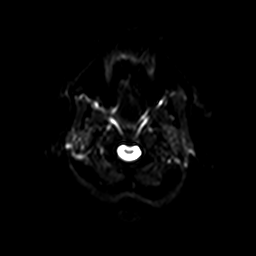
[im 16/80]
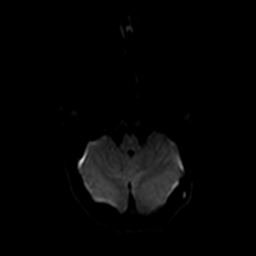
[im 32/80]
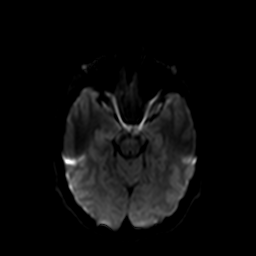
[im 48/80]
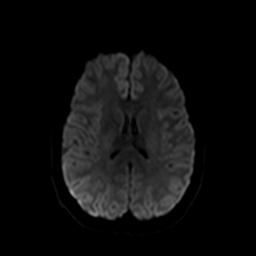
[im 64/80]
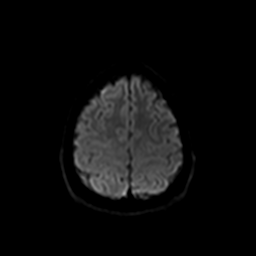
[im 80/80]
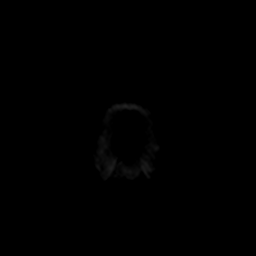

[Series 12: ax dwi_adc · axial · 3.0mm · 0.94mm/px · z∈[-82,+58]mm · 3 of 40 slices shown]
[im 1/40]
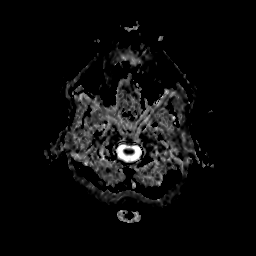
[im 20/40]
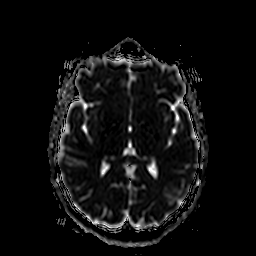
[im 40/40]
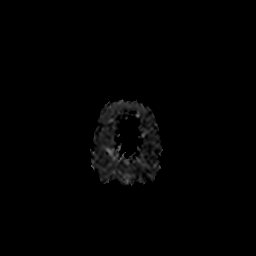

[Series 13: T2 · axial · 4.0mm · 0.36mm/px · z∈[-80,+60]mm · 2 of 28 slices shown (1 of 2)]
[im 1/28]
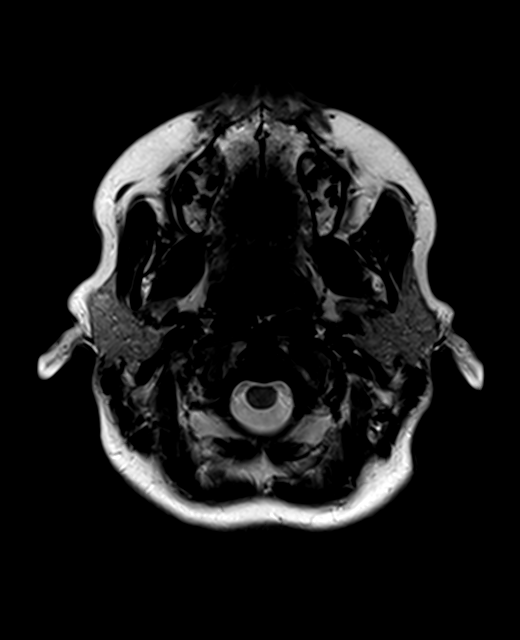
[im 28/28]
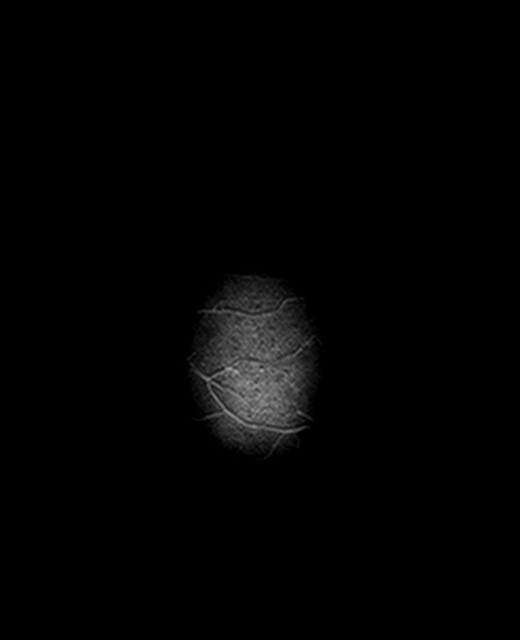

[Series 14: FLAIR · axial · 3.0mm · 0.72mm/px · z∈[-87,+63]mm · 2 of 26 slices shown]
[im 1/26]
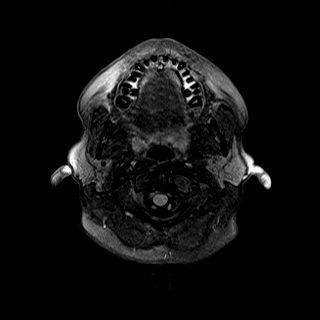
[im 26/26]
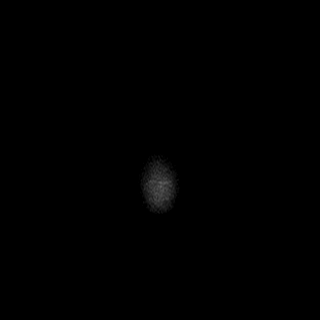

[Series 16: swi_images · axial · 3.0mm · 0.90mm/px · z∈[-104,+84]mm · 5 of 64 slices shown]
[im 1/64]
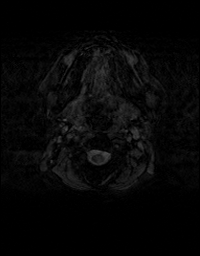
[im 16/64]
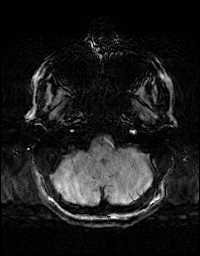
[im 32/64]
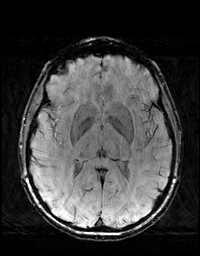
[im 48/64]
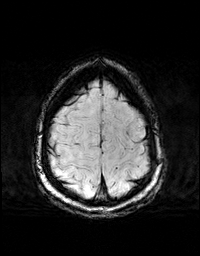
[im 64/64]
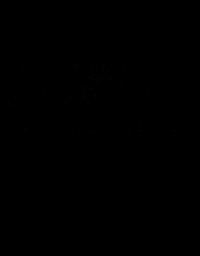

[Series 17: T1 · axial · 1.0mm · 0.90mm/px · z∈[-100,+57]mm · 12 of 159 slices shown (2 of 2)]
[im 1/159]
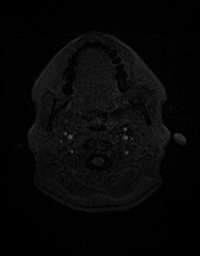
[im 15/159]
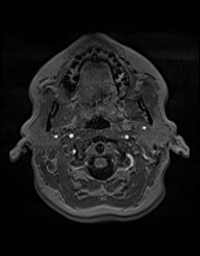
[im 29/159]
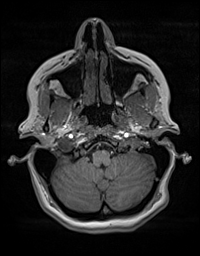
[im 44/159]
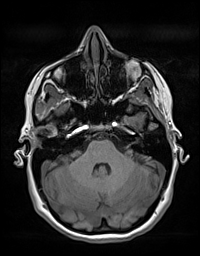
[im 58/159]
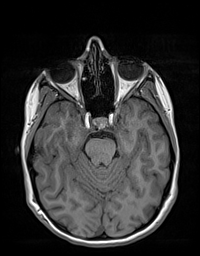
[im 72/159]
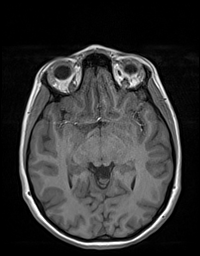
[im 87/159]
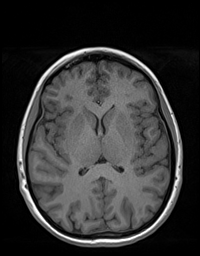
[im 101/159]
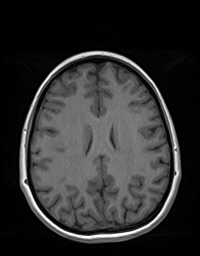
[im 115/159]
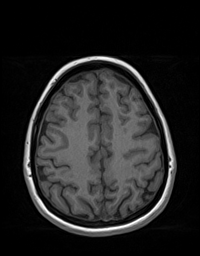
[im 130/159]
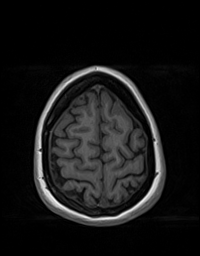
[im 144/159]
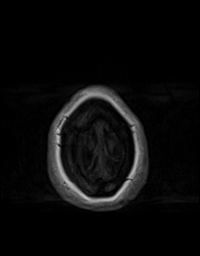
[im 159/159]
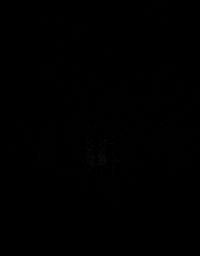

[Series 18: T2 · coronal · 4.5mm · 0.36mm/px · 2 of 32 slices shown (2 of 2)]
[im 1/32]
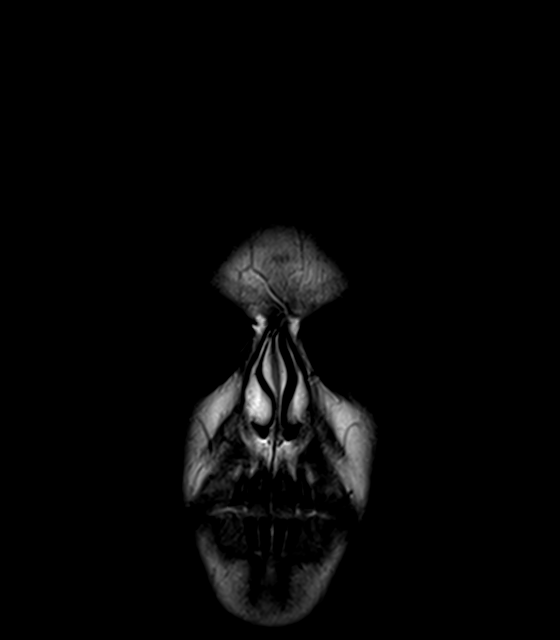
[im 32/32]
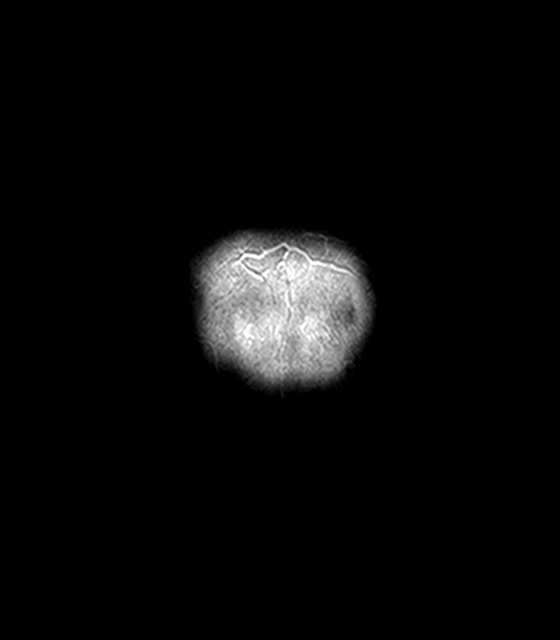

[48 of 48 positions shown; findings below may reference images not displayed]

FINDINGS: MRI HEAD WITHOUT CONTRAST

Brain: No acute infarction, hemorrhage, hydrocephalus, extra-axial
collection or mass lesion. Enlarged and upwardly convex pituitary
gland.

Vascular: Major arterial flow voids are maintained at the skull
base.

Skull and upper cervical spine: Normal marrow signal.

Sinuses/Orbits: Clear sinuses.  No acute orbital findings.

Other: No mastoid effusions.

MR VENOGRAM WITHOUT CONTRAST

No evidence of dural venous sinus thrombosis. The superior sagittal
sinus, sigmoid, transverse and straight sinuses are patent.
Visualized deep cerebral veins are patent. Jugular bulbs are patent.
IMPRESSION: 1. No evidence of acute intracranial abnormality.
2. No evidence of dural venous sinus thrombosis.
3. Enlarged and upwardly convex pituitary gland, likely physiologic
hypertrophy given the patient is pregnant. A follow-up MRI after
pregnancy could ensure resolution if clinically warranted.
